# Patient Record
Sex: Male | Born: 1950 | Race: White | Hispanic: No | Marital: Married | Smoking: Never smoker
Health system: Southern US, Community
[De-identification: ages and names within clinical notes are randomized; demographics above are authoritative.]

## PROBLEM LIST (undated history)

## (undated) DIAGNOSIS — E119 Type 2 diabetes mellitus without complications: Secondary | ICD-10-CM

## (undated) DIAGNOSIS — I2510 Atherosclerotic heart disease of native coronary artery without angina pectoris: Secondary | ICD-10-CM

## (undated) DIAGNOSIS — C801 Malignant (primary) neoplasm, unspecified: Secondary | ICD-10-CM

## (undated) DIAGNOSIS — I10 Essential (primary) hypertension: Secondary | ICD-10-CM

## (undated) DIAGNOSIS — E7800 Pure hypercholesterolemia, unspecified: Secondary | ICD-10-CM

## (undated) HISTORY — PX: CORONARY ARTERY BYPASS GRAFT: SHX141

---

## 2015-04-23 ENCOUNTER — Emergency Department: Payer: BLUE CROSS/BLUE SHIELD

## 2015-04-23 ENCOUNTER — Other Ambulatory Visit: Payer: Self-pay

## 2015-04-23 ENCOUNTER — Emergency Department
Admission: EM | Admit: 2015-04-23 | Discharge: 2015-04-23 | Payer: BLUE CROSS/BLUE SHIELD | Attending: Emergency Medicine | Admitting: Emergency Medicine

## 2015-04-23 ENCOUNTER — Ambulatory Visit (HOSPITAL_COMMUNITY)
Admission: AD | Admit: 2015-04-23 | Discharge: 2015-04-23 | Disposition: A | Discharge status: Home / Self Care | Payer: BLUE CROSS/BLUE SHIELD | Source: Other Acute Inpatient Hospital | Attending: Emergency Medicine | Admitting: Emergency Medicine

## 2015-04-23 ENCOUNTER — Encounter: Payer: Self-pay | Admitting: Emergency Medicine

## 2015-04-23 DIAGNOSIS — N179 Acute kidney failure, unspecified: Secondary | ICD-10-CM | POA: Insufficient documentation

## 2015-04-23 DIAGNOSIS — R0989 Other specified symptoms and signs involving the circulatory and respiratory systems: Secondary | ICD-10-CM | POA: Insufficient documentation

## 2015-04-23 DIAGNOSIS — I10 Essential (primary) hypertension: Secondary | ICD-10-CM | POA: Insufficient documentation

## 2015-04-23 DIAGNOSIS — R55 Syncope and collapse: Secondary | ICD-10-CM

## 2015-04-23 DIAGNOSIS — I959 Hypotension, unspecified: Secondary | ICD-10-CM | POA: Insufficient documentation

## 2015-04-23 DIAGNOSIS — M545 Low back pain, unspecified: Secondary | ICD-10-CM

## 2015-04-23 DIAGNOSIS — E860 Dehydration: Secondary | ICD-10-CM | POA: Insufficient documentation

## 2015-04-23 DIAGNOSIS — E119 Type 2 diabetes mellitus without complications: Secondary | ICD-10-CM | POA: Insufficient documentation

## 2015-04-23 HISTORY — DX: Pure hypercholesterolemia, unspecified: E78.00

## 2015-04-23 HISTORY — DX: Atherosclerotic heart disease of native coronary artery without angina pectoris: I25.10

## 2015-04-23 HISTORY — DX: Essential (primary) hypertension: I10

## 2015-04-23 HISTORY — DX: Malignant (primary) neoplasm, unspecified: C80.1

## 2015-04-23 HISTORY — DX: Type 2 diabetes mellitus without complications: E11.9

## 2015-04-23 LAB — TROPONIN I
Troponin I: 0.06 ng/mL — ABNORMAL HIGH (ref <0.031)
Troponin I: 0.06 ng/mL — ABNORMAL HIGH (ref <0.031)

## 2015-04-23 LAB — BASIC METABOLIC PANEL
Anion gap: 6 (ref 5–15)
BUN: 25 mg/dL — ABNORMAL HIGH (ref 6–20)
CALCIUM: 8.5 mg/dL — AB (ref 8.9–10.3)
CO2: 27 mmol/L (ref 22–32)
CREATININE: 1.56 mg/dL — AB (ref 0.61–1.24)
Chloride: 100 mmol/L — ABNORMAL LOW (ref 101–111)
GFR calc Af Amer: 53 mL/min — ABNORMAL LOW (ref >60)
GFR calc non Af Amer: 46 mL/min — ABNORMAL LOW (ref >60)
GLUCOSE: 307 mg/dL — AB (ref 65–99)
Potassium: 4.5 mmol/L (ref 3.5–5.1)
Sodium: 133 mmol/L — ABNORMAL LOW (ref 135–145)

## 2015-04-23 LAB — URINALYSIS COMPLETE WITH MICROSCOPIC (ARMC ONLY)
Bilirubin Urine: NEGATIVE
Glucose, UA: NEGATIVE mg/dL
Hgb urine dipstick: NEGATIVE
Ketones, ur: NEGATIVE mg/dL
LEUKOCYTES UA: NEGATIVE
Nitrite: NEGATIVE
PH: 5 (ref 5.0–8.0)
PROTEIN: NEGATIVE mg/dL
RBC / HPF: NONE SEEN RBC/hpf (ref 0–5)
Specific Gravity, Urine: 1.006 (ref 1.005–1.030)
Squamous Epithelial / LPF: NONE SEEN
WBC, UA: NONE SEEN WBC/hpf (ref 0–5)

## 2015-04-23 LAB — CBC
HEMATOCRIT: 29.7 % — AB (ref 40.0–52.0)
HEMOGLOBIN: 9.6 g/dL — AB (ref 13.0–18.0)
MCH: 27.7 pg (ref 26.0–34.0)
MCHC: 32.3 g/dL (ref 32.0–36.0)
MCV: 85.8 fL (ref 80.0–100.0)
Platelets: 446 10*3/uL — ABNORMAL HIGH (ref 150–440)
RBC: 3.47 MIL/uL — AB (ref 4.40–5.90)
RDW: 14 % (ref 11.5–14.5)
WBC: 7.1 10*3/uL (ref 3.8–10.6)

## 2015-04-23 LAB — GLUCOSE, CAPILLARY: GLUCOSE-CAPILLARY: 141 mg/dL — AB (ref 70–99)

## 2015-04-23 MED ORDER — IOHEXOL 350 MG/ML SOLN
100.0000 mL | Freq: Once | INTRAVENOUS | Status: AC | PRN
  Administered 2015-04-23: 100 mL via INTRAVENOUS

## 2015-04-23 MED ORDER — SODIUM CHLORIDE 0.9 % IV SOLN
Freq: Once | INTRAVENOUS | Status: AC
  Administered 2015-04-23: 15:00:00 via INTRAVENOUS

## 2015-04-23 MED ORDER — SODIUM CHLORIDE 0.9 % IV BOLUS (SEPSIS)
1000.0000 mL | Freq: Once | INTRAVENOUS | Status: AC
  Administered 2015-04-23: 1000 mL via INTRAVENOUS

## 2015-04-23 NOTE — ED Provider Notes (Signed)
[facility] Emergency Department Provider Note   ____________________________________________  Time seen: 1:45 PM   I have reviewed the triage vital signs and the nursing notes.   HISTORY  Chief Complaint Near Syncope    HPI Ricky Parks is a 64 y.o. male who had a quadruple bypass 3 weeks ago at his home hospital near [facility]. Since that time he has had problems with orthostatic hypotension syncope and near syncope. He came in town to visit his brother-in-law who is dying at the hospice house. His family member did pass today. The patient felt that his blood pressure was dropping and staff stated that his color was pale. He did not pass out. He did not have any chest pain. He denies any shortness of breath. Symptoms are moderate. On Thursday his cardiologist instructed him to cut down his blood pressure pill to one half in the morning and one half at night only when necessary based off of checking the blood pressure in the morning and at night. Patient is extremely adamant that he does not want to stay in the hospital      Past Medical History  Diagnosis Date  . Coronary artery disease   . Diabetes mellitus without complication   . Hypertension   . Hypercholesteremia     There are no active problems to display for this patient.   Past Surgical History  Procedure Laterality Date  . Coronary artery bypass graft      No current outpatient prescriptions on file.  Allergies Review of patient's allergies indicates no known allergies.  History reviewed. No pertinent family history.  Social History History  Substance Use Topics  . Smoking status: Never Smoker   . Smokeless tobacco: Never Used  . Alcohol Use: No    Review of Systems  Constitutional: Negative for fever. Eyes: Negative for visual changes. ENT: Negative for sore throat. Cardiovascular: Negative for chest pain. Respiratory: Negative for shortness of  breath. Gastrointestinal: Negative for abdominal pain, vomiting and diarrhea. Genitourinary:  Musculoskeletal:  Skin: Negative for rash. Neurological: Negative for headaches, focal weakness or numbness.   10-point ROS otherwise negative.  ____________________________________________   PHYSICAL EXAM:  VITAL SIGNS: ED Triage Vitals  Enc Vitals Group     BP 04/23/15 1320 105/52 mmHg     Pulse Rate 04/23/15 1320 62     Resp 04/23/15 1320 20     Temp 04/23/15 1320 98.4 F (36.9 C)     Temp Source 04/23/15 1320 Oral     SpO2 04/23/15 1320 99 %     Weight 04/23/15 1320 191 lb (86.637 kg)     Height 04/23/15 1320 6' (1.829 m)     Head Cir --      Peak Flow --      Pain Score --      Pain Loc --      Pain Edu? --      Excl. in [place]? --      Constitutional: Alert and oriented. Well appearing and in no distress. Eyes: Conjunctivae are normal. PERRL. Normal extraocular movements. ENT   Head: Normocephalic and atraumatic.   Nose: No congestion/rhinnorhea.   Mouth/Throat: Mucous membranes are moist.   Neck: No stridor. Cardiovascular: Normal rate, regular rhythm.  No murmurs, rubs, or gallops. Respiratory: Normal respiratory effort without tachypnea nor retractions. Breath sounds are clear and equal bilaterally. No wheezes/rales/rhonchi. Gastrointestinal: Soft and nontender. No distention.  Genitourinary:  Musculoskeletal: Nontender with normal range of motion in  all extremities. No joint effusions.  No lower extremity tenderness nor edema. Neurologic:  Normal speech and language. No gross focal neurologic deficits are appreciated. Speech is normal. No gait instability. Skin:  Skin is warm, dry and intact. No rash noted. Psychiatric: Mood and affect are normal. Speech and behavior are normal. Patient exhibits appropriate insight and judgment.  ____________________________________________   EKG  No prior EKG for comparison 62 bpm normal sinus rhythm. Left axis  deviation. Q waves septally. T-wave inversions V2 through V6.  ____________________________________________   LABS (pertinent positives/negatives)  Urinalysis negative Troponin 0.06 and 0.06 on 3R repeat BUN 25 cranny 1.56 Glucose 307 Hemoglobin 9.6 with white blood cell count normal at 7.1  ____________________________________________    RADIOLOGY  Chest x-ray findings reviewed: Negative  Reviewed CT angios chest abdomen pelvis and discussed with the radiologist: No aortic dissection. Recent CABG with gas and fluid collection in the mediastinum raising concern for possible infectious mediastinitis and abscess, 2 subcentimeter outpouchings from the anterior ascending aorta which may represent small pseudoaneurysms or possibly occluded grafts ____________________________________________   PROCEDURES  Procedure(s) performed: None Critical Care performed:  No  ____________________________________________   INITIAL IMPRESSION / ASSESSMENT AND PLAN / ED COURSE  Pertinent labs & imaging results that were available during my care of the patient were reviewed by me and considered in my medical decision making (see chart for details).  Initially felt patient's drop in blood pressure upon standing was likely due to his recent orthostatic hypotension episodes due to blood pressure medication changes since his recent CABG. His BUN/creatinine are elevated raising concern for dehydration. Patient was given 1 L normal saline and orthostatic to repeated and still found to be hypotensive in the 80s upon standing. Second liter was bolused was given and repeat orthostatics still found him to be hypotensive in the 80s upon standing but with a good pressure above 110 at rest. Patient is pretty adamant that he would like to go home and I told him we needed to get his blood pressure managed. It was noted by the nurse that he was starting to have hypotension in the 70s and 80s even at rest, although he  had no fever and no tachycardia. I started a third liter of fluid and considered other etiologies of hypotension including possibility of some sort of sepsis. He has no white blood cell count is chest x-ray was clear. His urine analysis is negative for urinary tract infection. Blood cultures were sent. Patient was complaining of some low back pain initially thought this was due to having been lying on the stretcher for too long however given the hypotension and the back pain chose to do a CT to rule out any aortic emergency.  Radiologist called me to discuss the CT results including gas and fluid in the mediastinum which could be postsurgical versus mediastinitis and abscess. The patient is not having chest pain, fever, elevated white blood cell count, or chest pain and so I doubt the diagnosis of mediastinitis and abscess. There is also finding of possible graft occlusion of the CT result. The patient again is not having acute chest pain and troponin is minimally elevated at 0.06 but unchanged over several hours. I have no prior EKG for comparison. I discussed with the patient has family the need for hospitalization at least for 24 observation in order to assess for any change in his current condition and to see a cardiologist regarding the CT scan results about the grafts. Patient would like to  be transferred back to his home hospital and cardiologist and cardiothoracic surgeon in [facility]. I discussed with them the risk of the prolonged transport although I do feel he is safe for transport.  I discussed with the cardiologist on-call at patient's home hospital and discussed the CT results and the patient's clinical picture. Without chest pain shortness of breath or EKG abnormalities the cardiologist has no additional recognition at this point time but the patient to be transferred to either hospitalist or CT surgery's service. Patient's clinical picture does not specifically suggest the  diagnosis of infectious mediastinitis, or acute graft occlusion.  I spoke with Dr. Christia Reading, hospitalist, who accepted the patient in transfer.   ____________________________________________   FINAL CLINICAL IMPRESSION(S) / ED DIAGNOSES  Acute dehydration with mild acute renal failure and hypotension Possible cardiac graft occlusion    Lisa Roca, MD 04/23/15 2150

## 2015-04-23 NOTE — ED Notes (Signed)
Patient to ED after near syncopal episode at [facility]. Patient reports cardiac bypass about a month and has passed out 3 times since then. Patient was at [facility] due to the very recent passing of his brother in law. Patient appears pale on assessment.

## 2015-04-23 NOTE — ED Notes (Signed)
CRITICAL VALUE ALERT  Critical value received:  Troponin 0.06  Date of notification:  04/23/15  Time of notification:  141  Critical value read back:Yes.    Nurse who received alert:   Desanctis  MD notified (1st page):  1415  Time of first page:  1415  MD notified (2nd page):  Time of second page:  Responding MD: Dr Reita Cliche  Time MD responded:  [phone]

## 2015-04-23 NOTE — ED Notes (Signed)
Blood pressure 84/44, pt resting, pt denies pain or any problems, Dr.Lord notified

## 2015-04-23 NOTE — ED Notes (Signed)
Patient transported to CT 

## 2015-04-23 NOTE — ED Notes (Signed)
Stood pt blood pressure dropped from 127/81 to 83/53, pt became hot and dizzy, Dr.Lord notified

## 2015-04-28 LAB — CULTURE, BLOOD (ROUTINE X 2)
Culture: NO GROWTH
Culture: NO GROWTH

## 2017-03-12 ENCOUNTER — Encounter: Payer: Self-pay | Admitting: *Deleted

## 2017-03-12 ENCOUNTER — Emergency Department: Payer: 59

## 2017-03-12 ENCOUNTER — Inpatient Hospital Stay: Payer: 59

## 2017-03-12 ENCOUNTER — Inpatient Hospital Stay
Admission: EM | Admit: 2017-03-12 | Discharge: 2017-03-14 | DRG: 092 | Disposition: A | Discharge status: Home Health | Payer: 59 | Attending: Internal Medicine | Admitting: Internal Medicine

## 2017-03-12 DIAGNOSIS — K7290 Hepatic failure, unspecified without coma: Secondary | ICD-10-CM | POA: Diagnosis present

## 2017-03-12 DIAGNOSIS — Z7982 Long term (current) use of aspirin: Secondary | ICD-10-CM

## 2017-03-12 DIAGNOSIS — R569 Unspecified convulsions: Secondary | ICD-10-CM | POA: Diagnosis present

## 2017-03-12 DIAGNOSIS — R4182 Altered mental status, unspecified: Secondary | ICD-10-CM | POA: Diagnosis present

## 2017-03-12 DIAGNOSIS — Z8249 Family history of ischemic heart disease and other diseases of the circulatory system: Secondary | ICD-10-CM | POA: Diagnosis not present

## 2017-03-12 DIAGNOSIS — N179 Acute kidney failure, unspecified: Secondary | ICD-10-CM | POA: Diagnosis present

## 2017-03-12 DIAGNOSIS — Z79899 Other long term (current) drug therapy: Secondary | ICD-10-CM

## 2017-03-12 DIAGNOSIS — Z951 Presence of aortocoronary bypass graft: Secondary | ICD-10-CM

## 2017-03-12 DIAGNOSIS — R4781 Slurred speech: Secondary | ICD-10-CM | POA: Diagnosis present

## 2017-03-12 DIAGNOSIS — F329 Major depressive disorder, single episode, unspecified: Secondary | ICD-10-CM | POA: Diagnosis present

## 2017-03-12 DIAGNOSIS — R410 Disorientation, unspecified: Secondary | ICD-10-CM

## 2017-03-12 DIAGNOSIS — Z823 Family history of stroke: Secondary | ICD-10-CM | POA: Diagnosis not present

## 2017-03-12 DIAGNOSIS — I2510 Atherosclerotic heart disease of native coronary artery without angina pectoris: Secondary | ICD-10-CM | POA: Diagnosis present

## 2017-03-12 DIAGNOSIS — I129 Hypertensive chronic kidney disease with stage 1 through stage 4 chronic kidney disease, or unspecified chronic kidney disease: Secondary | ICD-10-CM | POA: Diagnosis present

## 2017-03-12 DIAGNOSIS — E7800 Pure hypercholesterolemia, unspecified: Secondary | ICD-10-CM | POA: Diagnosis present

## 2017-03-12 DIAGNOSIS — I509 Heart failure, unspecified: Secondary | ICD-10-CM

## 2017-03-12 DIAGNOSIS — Z794 Long term (current) use of insulin: Secondary | ICD-10-CM

## 2017-03-12 DIAGNOSIS — F0390 Unspecified dementia without behavioral disturbance: Secondary | ICD-10-CM | POA: Diagnosis present

## 2017-03-12 DIAGNOSIS — E7220 Disorder of urea cycle metabolism, unspecified: Secondary | ICD-10-CM | POA: Diagnosis present

## 2017-03-12 DIAGNOSIS — Z7901 Long term (current) use of anticoagulants: Secondary | ICD-10-CM | POA: Diagnosis not present

## 2017-03-12 DIAGNOSIS — Z8673 Personal history of transient ischemic attack (TIA), and cerebral infarction without residual deficits: Secondary | ICD-10-CM

## 2017-03-12 DIAGNOSIS — E1122 Type 2 diabetes mellitus with diabetic chronic kidney disease: Secondary | ICD-10-CM | POA: Diagnosis present

## 2017-03-12 DIAGNOSIS — K7682 Hepatic encephalopathy: Secondary | ICD-10-CM

## 2017-03-12 DIAGNOSIS — G92 Toxic encephalopathy: Principal | ICD-10-CM | POA: Diagnosis present

## 2017-03-12 DIAGNOSIS — Z820 Family history of epilepsy and other diseases of the nervous system: Secondary | ICD-10-CM

## 2017-03-12 DIAGNOSIS — T426X5A Adverse effect of other antiepileptic and sedative-hypnotic drugs, initial encounter: Secondary | ICD-10-CM | POA: Diagnosis present

## 2017-03-12 DIAGNOSIS — N183 Chronic kidney disease, stage 3 (moderate): Secondary | ICD-10-CM | POA: Diagnosis present

## 2017-03-12 DIAGNOSIS — Z833 Family history of diabetes mellitus: Secondary | ICD-10-CM

## 2017-03-12 DIAGNOSIS — Z66 Do not resuscitate: Secondary | ICD-10-CM | POA: Diagnosis present

## 2017-03-12 DIAGNOSIS — G9340 Encephalopathy, unspecified: Secondary | ICD-10-CM

## 2017-03-12 LAB — URINE DRUG SCREEN, QUALITATIVE (ARMC ONLY)
Amphetamines, Ur Screen: NOT DETECTED
Barbiturates, Ur Screen: NOT DETECTED
Benzodiazepine, Ur Scrn: NOT DETECTED
CANNABINOID 50 NG, UR ~~LOC~~: NOT DETECTED
COCAINE METABOLITE, UR ~~LOC~~: NOT DETECTED
MDMA (Ecstasy)Ur Screen: NOT DETECTED
METHADONE SCREEN, URINE: NOT DETECTED
Opiate, Ur Screen: NOT DETECTED
Phencyclidine (PCP) Ur S: NOT DETECTED
Tricyclic, Ur Screen: POSITIVE — AB

## 2017-03-12 LAB — COMPREHENSIVE METABOLIC PANEL
ALT: 18 U/L (ref 17–63)
AST: 26 U/L (ref 15–41)
Albumin: 3.2 g/dL — ABNORMAL LOW (ref 3.5–5.0)
Alkaline Phosphatase: 34 U/L — ABNORMAL LOW (ref 38–126)
Anion gap: 7 (ref 5–15)
BUN: 31 mg/dL — ABNORMAL HIGH (ref 6–20)
CHLORIDE: 102 mmol/L (ref 101–111)
CO2: 24 mmol/L (ref 22–32)
CREATININE: 1.85 mg/dL — AB (ref 0.61–1.24)
Calcium: 8.8 mg/dL — ABNORMAL LOW (ref 8.9–10.3)
GFR calc Af Amer: 43 mL/min — ABNORMAL LOW (ref >60)
GFR calc non Af Amer: 37 mL/min — ABNORMAL LOW (ref >60)
Glucose, Bld: 317 mg/dL — ABNORMAL HIGH (ref 65–99)
Potassium: 4.7 mmol/L (ref 3.5–5.1)
Sodium: 133 mmol/L — ABNORMAL LOW (ref 135–145)
Total Bilirubin: 0.5 mg/dL (ref 0.3–1.2)
Total Protein: 6.4 g/dL — ABNORMAL LOW (ref 6.5–8.1)

## 2017-03-12 LAB — URINALYSIS, COMPLETE (UACMP) WITH MICROSCOPIC
Bacteria, UA: NONE SEEN
Bilirubin Urine: NEGATIVE
Hgb urine dipstick: NEGATIVE
KETONES UR: NEGATIVE mg/dL
Leukocytes, UA: NEGATIVE
Nitrite: NEGATIVE
PROTEIN: 30 mg/dL — AB
RBC / HPF: NONE SEEN RBC/hpf (ref 0–5)
SQUAMOUS EPITHELIAL / LPF: NONE SEEN
Specific Gravity, Urine: 1.02 (ref 1.005–1.030)
pH: 6 (ref 5.0–8.0)

## 2017-03-12 LAB — BLOOD GAS, VENOUS
ACID-BASE DEFICIT: 0.4 mmol/L (ref 0.0–2.0)
Bicarbonate: 25.9 mmol/L (ref 20.0–28.0)
O2 Saturation: 86.7 %
PH VEN: 7.34 (ref 7.250–7.430)
PO2 VEN: 56 mmHg — AB (ref 32.0–45.0)
Patient temperature: 37
pCO2, Ven: 48 mmHg (ref 44.0–60.0)

## 2017-03-12 LAB — VALPROIC ACID LEVEL: Valproic Acid Lvl: <10 ug/mL — ABNORMAL LOW (ref 50.0–100.0)

## 2017-03-12 LAB — GLUCOSE, CAPILLARY
Glucose-Capillary: 332 mg/dL — ABNORMAL HIGH (ref 65–99)
Glucose-Capillary: 346 mg/dL — ABNORMAL HIGH (ref 65–99)

## 2017-03-12 LAB — TROPONIN I: Troponin I: <0.03 ng/mL (ref <0.03)

## 2017-03-12 LAB — CBC
HEMATOCRIT: 36.9 % — AB (ref 40.0–52.0)
Hemoglobin: 12.7 g/dL — ABNORMAL LOW (ref 13.0–18.0)
MCH: 30 pg (ref 26.0–34.0)
MCHC: 34.3 g/dL (ref 32.0–36.0)
MCV: 87.5 fL (ref 80.0–100.0)
PLATELETS: 226 10*3/uL (ref 150–440)
RBC: 4.21 MIL/uL — AB (ref 4.40–5.90)
RDW: 14.9 % — ABNORMAL HIGH (ref 11.5–14.5)
WBC: 5.1 10*3/uL (ref 3.8–10.6)

## 2017-03-12 LAB — PROTIME-INR
INR: 0.96
PROTHROMBIN TIME: 12.8 s (ref 11.4–15.2)

## 2017-03-12 LAB — AMMONIA: Ammonia: 51 umol/L — ABNORMAL HIGH (ref 9–35)

## 2017-03-12 MED ORDER — DOCUSATE SODIUM 100 MG PO CAPS
100.0000 mg | ORAL_CAPSULE | Freq: Two times a day (BID) | ORAL | Status: DC
  Administered 2017-03-12 – 2017-03-14 (×4): 100 mg via ORAL
  Filled 2017-03-12 (×4): qty 1

## 2017-03-12 MED ORDER — GABAPENTIN 300 MG PO CAPS
300.0000 mg | ORAL_CAPSULE | Freq: Three times a day (TID) | ORAL | Status: DC
  Administered 2017-03-12 – 2017-03-13 (×2): 300 mg via ORAL
  Filled 2017-03-12 (×2): qty 1

## 2017-03-12 MED ORDER — APIXABAN 5 MG PO TABS
5.0000 mg | ORAL_TABLET | Freq: Two times a day (BID) | ORAL | Status: DC
  Administered 2017-03-12 – 2017-03-14 (×4): 5 mg via ORAL
  Filled 2017-03-12 (×4): qty 1

## 2017-03-12 MED ORDER — CARVEDILOL 3.125 MG PO TABS
3.1250 mg | ORAL_TABLET | Freq: Two times a day (BID) | ORAL | Status: DC
  Administered 2017-03-13 – 2017-03-14 (×3): 3.125 mg via ORAL
  Filled 2017-03-12 (×3): qty 1

## 2017-03-12 MED ORDER — OMEGA-3-ACID ETHYL ESTERS 1 G PO CAPS
1.0000 g | ORAL_CAPSULE | Freq: Every day | ORAL | Status: DC
  Administered 2017-03-13 – 2017-03-14 (×2): 1 g via ORAL
  Filled 2017-03-12 (×2): qty 1

## 2017-03-12 MED ORDER — ACETAMINOPHEN 325 MG PO TABS: 650.0000 mg | ORAL_TABLET | Freq: Four times a day (QID) | ORAL | Status: DC | PRN

## 2017-03-12 MED ORDER — BISACODYL 5 MG PO TBEC: 5.0000 mg | DELAYED_RELEASE_TABLET | Freq: Every day | ORAL | Status: DC | PRN

## 2017-03-12 MED ORDER — SODIUM CHLORIDE 0.9 % IV SOLN: INTRAVENOUS | Status: DC

## 2017-03-12 MED ORDER — ONDANSETRON HCL 4 MG PO TABS: 4.0000 mg | ORAL_TABLET | Freq: Four times a day (QID) | ORAL | Status: DC | PRN

## 2017-03-12 MED ORDER — DULOXETINE HCL 60 MG PO CPEP
60.0000 mg | ORAL_CAPSULE | Freq: Every morning | ORAL | Status: DC
  Administered 2017-03-13 – 2017-03-14 (×2): 60 mg via ORAL
  Filled 2017-03-12 (×2): qty 1

## 2017-03-12 MED ORDER — NALOXONE HCL 2 MG/2ML IJ SOSY
1.0000 mg | PREFILLED_SYRINGE | Freq: Once | INTRAMUSCULAR | Status: AC
  Administered 2017-03-12: 1 mg via INTRAVENOUS

## 2017-03-12 MED ORDER — VITAMIN B-12 1000 MCG PO TABS
1000.0000 ug | ORAL_TABLET | Freq: Every day | ORAL | Status: DC
  Administered 2017-03-13 – 2017-03-14 (×2): 1000 ug via ORAL
  Filled 2017-03-12 (×2): qty 1

## 2017-03-12 MED ORDER — FERROUS SULFATE 325 (65 FE) MG PO TABS
325.0000 mg | ORAL_TABLET | Freq: Three times a day (TID) | ORAL | Status: DC
  Administered 2017-03-12 – 2017-03-14 (×4): 325 mg via ORAL
  Filled 2017-03-12 (×4): qty 1

## 2017-03-12 MED ORDER — PRAVASTATIN SODIUM 40 MG PO TABS
80.0000 mg | ORAL_TABLET | Freq: Every day | ORAL | Status: DC
  Administered 2017-03-13 – 2017-03-14 (×2): 80 mg via ORAL
  Filled 2017-03-12 (×2): qty 2

## 2017-03-12 MED ORDER — MEMANTINE HCL 5 MG PO TABS
5.0000 mg | ORAL_TABLET | Freq: Two times a day (BID) | ORAL | Status: DC
  Administered 2017-03-13 – 2017-03-14 (×4): 5 mg via ORAL
  Filled 2017-03-12 (×5): qty 1

## 2017-03-12 MED ORDER — ONDANSETRON HCL 4 MG/2ML IJ SOLN: 4.0000 mg | Freq: Four times a day (QID) | INTRAMUSCULAR | Status: DC | PRN

## 2017-03-12 MED ORDER — FENOFIBRATE 54 MG PO TABS
54.0000 mg | ORAL_TABLET | Freq: Every day | ORAL | Status: DC
  Administered 2017-03-13 – 2017-03-14 (×2): 54 mg via ORAL
  Filled 2017-03-12 (×2): qty 1

## 2017-03-12 MED ORDER — LACTULOSE 10 GM/15ML PO SOLN
20.0000 g | Freq: Two times a day (BID) | ORAL | Status: DC
  Administered 2017-03-12 – 2017-03-14 (×4): 20 g via ORAL
  Filled 2017-03-12 (×4): qty 30

## 2017-03-12 MED ORDER — LOSARTAN POTASSIUM 50 MG PO TABS
25.0000 mg | ORAL_TABLET | Freq: Every day | ORAL | Status: DC
  Administered 2017-03-13 – 2017-03-14 (×2): 25 mg via ORAL
  Filled 2017-03-12 (×2): qty 1

## 2017-03-12 MED ORDER — CITALOPRAM HYDROBROMIDE 20 MG PO TABS
40.0000 mg | ORAL_TABLET | Freq: Every day | ORAL | Status: DC
  Administered 2017-03-13 – 2017-03-14 (×2): 40 mg via ORAL
  Filled 2017-03-12 (×2): qty 2

## 2017-03-12 MED ORDER — DIVALPROEX SODIUM 250 MG PO DR TAB
250.0000 mg | DELAYED_RELEASE_TABLET | Freq: Two times a day (BID) | ORAL | Status: DC
  Filled 2017-03-12: qty 1

## 2017-03-12 MED ORDER — INSULIN DEGLUDEC 200 UNIT/ML ~~LOC~~ SOPN: 54.0000 [IU] | PEN_INJECTOR | Freq: Every day | SUBCUTANEOUS | Status: DC

## 2017-03-12 MED ORDER — INSULIN ASPART 100 UNIT/ML ~~LOC~~ SOLN
4.0000 [IU] | Freq: Three times a day (TID) | SUBCUTANEOUS | Status: DC
Start: 2017-03-13 — End: 2017-03-14
  Administered 2017-03-13 – 2017-03-14 (×3): 4 [IU] via SUBCUTANEOUS
  Filled 2017-03-12 (×4): qty 4

## 2017-03-12 MED ORDER — LACTULOSE 10 GM/15ML PO SOLN
20.0000 g | Freq: Once | ORAL | Status: AC
  Administered 2017-03-12: 20 g via ORAL
  Filled 2017-03-12: qty 30

## 2017-03-12 MED ORDER — NALOXONE HCL 2 MG/2ML IJ SOSY
0.4000 mg | PREFILLED_SYRINGE | Freq: Once | INTRAMUSCULAR | Status: AC
  Administered 2017-03-12: 0.4 mg via INTRAVENOUS
  Filled 2017-03-12: qty 2

## 2017-03-12 MED ORDER — ADULT MULTIVITAMIN W/MINERALS CH
ORAL_TABLET | Freq: Every day | ORAL | Status: DC
  Administered 2017-03-13 – 2017-03-14 (×2): 1 via ORAL
  Filled 2017-03-12 (×2): qty 1

## 2017-03-12 MED ORDER — ASPIRIN EC 325 MG PO TBEC
325.0000 mg | DELAYED_RELEASE_TABLET | Freq: Every day | ORAL | Status: DC
  Administered 2017-03-13 – 2017-03-14 (×2): 325 mg via ORAL
  Filled 2017-03-12 (×2): qty 1

## 2017-03-12 MED ORDER — INSULIN GLARGINE 100 UNIT/ML ~~LOC~~ SOLN
32.0000 [IU] | Freq: Every day | SUBCUTANEOUS | Status: DC
  Administered 2017-03-12 – 2017-03-14 (×3): 32 [IU] via SUBCUTANEOUS
  Filled 2017-03-12 (×4): qty 0.32

## 2017-03-12 MED ORDER — ACETAMINOPHEN 650 MG RE SUPP: 650.0000 mg | Freq: Four times a day (QID) | RECTAL | Status: DC | PRN

## 2017-03-12 MED ORDER — DULOXETINE HCL 30 MG PO CPEP: 30.0000 mg | ORAL_CAPSULE | Freq: Every day | ORAL | Status: DC

## 2017-03-12 MED ORDER — ISOSORBIDE DINITRATE 30 MG PO TABS
30.0000 mg | ORAL_TABLET | Freq: Four times a day (QID) | ORAL | Status: DC
  Administered 2017-03-13 – 2017-03-14 (×5): 30 mg via ORAL
  Filled 2017-03-12 (×9): qty 1

## 2017-03-12 MED ORDER — INSULIN ASPART 100 UNIT/ML ~~LOC~~ SOLN
0.0000 [IU] | Freq: Three times a day (TID) | SUBCUTANEOUS | Status: DC
  Administered 2017-03-13 (×2): 8 [IU] via SUBCUTANEOUS
  Administered 2017-03-14: 09:00:00 5 [IU] via SUBCUTANEOUS
  Filled 2017-03-12 (×2): qty 8
  Filled 2017-03-12: qty 5

## 2017-03-12 MED ORDER — FAMOTIDINE 20 MG PO TABS
20.0000 mg | ORAL_TABLET | Freq: Two times a day (BID) | ORAL | Status: DC
  Administered 2017-03-12 – 2017-03-14 (×4): 20 mg via ORAL
  Filled 2017-03-12 (×4): qty 1

## 2017-03-12 MED ORDER — QUETIAPINE FUMARATE 25 MG PO TABS
50.0000 mg | ORAL_TABLET | Freq: Every day | ORAL | Status: DC
  Administered 2017-03-12 – 2017-03-13 (×2): 50 mg via ORAL
  Filled 2017-03-12 (×2): qty 2

## 2017-03-12 MED ORDER — DULOXETINE HCL 30 MG PO CPEP
30.0000 mg | ORAL_CAPSULE | Freq: Every evening | ORAL | Status: DC
  Administered 2017-03-13: 30 mg via ORAL
  Filled 2017-03-12: qty 1

## 2017-03-12 MED ORDER — LIRAGLUTIDE 18 MG/3ML ~~LOC~~ SOPN: 1.8000 mg | PEN_INJECTOR | Freq: Every day | SUBCUTANEOUS | Status: DC

## 2017-03-12 MED ORDER — SODIUM CHLORIDE 0.9 % IV SOLN
INTRAVENOUS | Status: AC
  Administered 2017-03-12 – 2017-03-13 (×2): via INTRAVENOUS

## 2017-03-12 MED ORDER — COLESEVELAM HCL 625 MG PO TABS
1875.0000 mg | ORAL_TABLET | Freq: Two times a day (BID) | ORAL | Status: DC
  Administered 2017-03-13 – 2017-03-14 (×2): 1875 mg via ORAL
  Filled 2017-03-12 (×4): qty 3

## 2017-03-12 NOTE — ED Notes (Signed)
MRI called this RN and reports they will call cardiologist themselves and request to speak with MD. Family updated.

## 2017-03-12 NOTE — ED Notes (Signed)
Admitting MD at the bedside.  

## 2017-03-12 NOTE — ED Notes (Signed)
RN called Dr. Daron Offer in [place] in regards to pts loop recorder. Per MRI loop recorder can erase data. RN called after hours line in order to inform MD of this. RN in [place] reports Dr. Daron Offer will call back shortly.

## 2017-03-12 NOTE — ED Notes (Signed)
Patient transported to MRI 

## 2017-03-12 NOTE — H&P (Signed)
Ricky Parks at [place] NAME: Ricky Parks    MR#:  [id number]  DATE OF BIRTH:  03/12/1951  DATE OF ADMISSION:  03/12/2017  PRIMARY CARE PHYSICIAN: Ricky Parks   REQUESTING/REFERRING PHYSICIAN: Merlyn Lot.  CHIEF COMPLAINT: Altered mental status    Chief Complaint  Patient presents with  . Altered Mental Status    HISTORY OF PRESENT ILLNESS:  Ricky Parks  is a 66 y.o. male with a known history of Cryptogenic strokes brought in by family because of sudden onset of altered mental status associated with problems with coordination, slight slurred speech. Patient was up with Dr. Lorenda Parks from [facility] neurology, last seen neurologist in a week ago that time his Depakote has been increased to 500 mg twice a day, patient also sees psychiatric for depression, recently increased Seroquel from 50 mg 200 mg on Thursday. And patient has been having episodes of confusion recently. Patient did Depakote level undetectable in the blood today with elevated ammonia up to more than 50. MRI of the brain showed old strokes in the right frontal, right occipital area but no new strokes. Patient is eating his dinner at this time.   PAST MEDICAL HISTORY:   Past Medical History:  Diagnosis Date  . Cancer ([place])   . Coronary artery disease   . Diabetes mellitus without complication ([place])   . Hypercholesteremia   . Hypertension     PAST SURGICAL HISTOIRY:   Past Surgical History:  Procedure Laterality Date  . CORONARY ARTERY BYPASS GRAFT      SOCIAL HISTORY:   Social History  Substance Use Topics  . Smoking status: Never Smoker  . Smokeless tobacco: Never Used  . Alcohol use No    FAMILY HISTORY:  History reviewed. No pertinent family history.  DRUG ALLERGIES:  No Known Allergies  REVIEW OF SYSTEMS:  CONSTITUTIONAL: No fever, fatigue or weakness.  EYES: No blurred or double vision.  EARS, NOSE, AND THROAT: No tinnitus or ear  pain.  RESPIRATORY: No cough, shortness of breath, wheezing or hemoptysis.  CARDIOVASCULAR: No chest pain, orthopnea, edema.  GASTROINTESTINAL: No nausea, vomiting, diarrhea or abdominal pain.  GENITOURINARY: No dysuria, hematuria.  ENDOCRINE: No polyuria, nocturia,  HEMATOLOGY: No anemia, easy bruising or bleeding SKIN: No rash or lesion. MUSCULOSKELETAL: No joint pain or arthritis.   NEUROLOGIC: No tingling, numbness, weakness.  PSYCHIATRY: No anxiety or depression.   MEDICATIONS AT HOME:   Prior to Admission medications   Medication Sig Start Date End Date Taking? Authorizing Provider  apixaban (ELIQUIS) 5 MG TABS tablet Take 5 mg by mouth 2 (two) times daily.   Yes Historical Provider, MD  aspirin 325 MG tablet Take 325 mg by mouth daily.   Yes Historical Provider, MD  B Complex-C (SUPER B COMPLEX PO) Take by mouth.   Yes Historical Provider, MD  butalbital-acetaminophen-caffeine (FIORICET, ESGIC) 50-325-40 MG tablet Take by mouth 2 (two) times daily as needed for headache.   Yes Historical Provider, MD  carvedilol (COREG) 3.125 MG tablet Take 3.125 mg by mouth 2 (two) times daily with a meal.   Yes Historical Provider, MD  Cholecalciferol (VITAMIN D3) 1000 UNITS CAPS Take 4 capsules by mouth daily.    Yes Historical Provider, MD  colesevelam (WELCHOL) 625 MG tablet Take 1,875 mg by mouth 2 (two) times daily with a meal.   Yes Historical Provider, MD  divalproex (DEPAKOTE) 250 MG DR tablet Take 250 mg by mouth 2 (two)  times daily.   Yes Historical Provider, MD  DULoxetine (CYMBALTA) 30 MG capsule Take 30-60 mg by mouth daily. 60mg  AM & 30mg  PM   Yes Historical Provider, MD  fenofibrate (TRICOR) 145 MG tablet Take 145 mg by mouth daily.   Yes Historical Provider, MD  fluticasone (FLONASE) 50 MCG/ACT nasal spray Place 2 sprays into both nostrils daily as needed.  01/26/15  Yes Historical Provider, MD  gabapentin (NEURONTIN) 300 MG capsule Take 300 mg by mouth 3 (three) times daily.   Yes  Historical Provider, MD  HUMALOG KWIKPEN 100 UNIT/ML KiwkPen Inject 32 Units into the skin 3 (three) times daily.  02/19/17  Yes Historical Provider, MD  isosorbide dinitrate (ISORDIL) 30 MG tablet Take 30 mg by mouth 4 (four) times daily.   Yes Historical Provider, MD  losartan (COZAAR) 25 MG tablet Take 25 mg by mouth daily.   Yes Historical Provider, MD  memantine (NAMENDA) 5 MG tablet Take 5 mg by mouth 2 (two) times daily.   Yes Historical Provider, MD  omega-3 acid ethyl esters (LOVAZA) 1 g capsule Take 1 g by mouth daily.   Yes Historical Provider, MD  QUEtiapine (SEROQUEL) 100 MG tablet Take 100 mg by mouth at bedtime.    Yes Historical Provider, MD  TRESIBA FLEXTOUCH 200 UNIT/ML SOPN Inject 54 Units into the skin daily.  02/25/17  Yes Historical Provider, MD  VICTOZA 18 MG/3ML SOPN Inject 1.8 mg into the skin daily.  02/19/17  Yes Historical Provider, MD  citalopram (CELEXA) 40 MG tablet Take 1 tablet by mouth daily.    Historical Provider, MD  cyanocobalamin 500 MCG tablet Take 2 tablets by mouth daily.    Historical Provider, MD  famotidine (PEPCID) 20 MG tablet Take 1 tablet by mouth 2 (two) times daily. For 30 days 04/11/15 05/11/15  Historical Provider, MD  ferrous sulfate 325 (65 FE) MG tablet Take 1 tablet by mouth 3 (three) times daily. For 30 days 04/11/15 05/11/15  Historical Provider, MD  insulin aspart (NOVOLOG) 100 UNIT/ML injection For Blood Glucose (mg/dL):Less than 70 = Follow Hypoglycemia Protocol70 - 119 = No Insulin120-149 = 1 unit 150-199 = 3 Units200-249 = 5 Units250-299 = 7 Units300-349 = 8 UnitsGreater than 349 = 8 units, recheck glucose in 2 hours and call MD if results greater then 349. **HIGH ALERT MED**  Independent double check required.   This is a fast-acting insulin and must be administered with food or source of glucose. 04/11/15   Historical Provider, MD  LANTUS SOLOSTAR 100 UNIT/ML Solostar Pen Inject 32 Units into the skin daily. 03/21/15   Historical Provider, MD   Multiple Vitamin (MULTI-VITAMINS) TABS Take 1 tablet by mouth daily.    Historical Provider, MD  Omega-3 Fatty Acids (FISH OIL) 1000 MG CAPS Take 4 capsules by mouth daily.    Historical Provider, MD  oxyCODONE-acetaminophen (PERCOCET/ROXICET) 5-325 MG per tablet Take 1-2 tablets by mouth every 4 (four) hours as needed. 04/11/15   Historical Provider, MD  pravastatin (PRAVACHOL) 80 MG tablet Take 1 tablet by mouth daily. 04/11/15   Historical Provider, MD      VITAL SIGNS:  Blood pressure 130/78, pulse 70, temperature 97.6 F (36.4 C), temperature source Oral, resp. rate 16, weight 86.6 kg (191 lb), SpO2 94 %.  PHYSICAL EXAMINATION:  GENERAL:  66 y.o.-year-old patient lying in the bed with no acute distress.  EYES: Pupils equal, round, reactive to light and accommodation. No scleral icterus. Extraocular muscles intact.  HEENT: Head atraumatic,  normocephalic. Oropharynx and nasopharynx clear.  NECK:  Supple, no jugular venous distention. No thyroid enlargement, no tenderness.  LUNGS: Normal breath sounds bilaterally, no wheezing, rales,rhonchi or crepitation. No use of accessory muscles of respiration.  CARDIOVASCULAR: S1, S2 normal. No murmurs, rubs, or gallops.  ABDOMEN: Soft, nontender, nondistended. Bowel sounds present. No organomegaly or mass.  EXTREMITIES: No pedal edema, cyanosis, or clubbing.  NEUROLOGIC: Cranial nerves II through XII are intact. Muscle strength 5/5 in all extremities. Sensation intact. Gait not checked.  PSYCHIATRIC: The patient is alert and oriented x 3.  SKIN: No obvious rash, lesion, or ulcer.   LABORATORY PANEL:   CBC  Recent Labs Lab 03/12/17 1624  WBC 5.1  HGB 12.7*  HCT 36.9*  PLT 226   ------------------------------------------------------------------------------------------------------------------  Chemistries   Recent Labs Lab 03/12/17 1624  NA 133*  K 4.7  CL 102  CO2 24  GLUCOSE 317*  BUN 31*  CREATININE 1.85*  CALCIUM 8.8*  AST  26  ALT 18  ALKPHOS 34*  BILITOT 0.5   ------------------------------------------------------------------------------------------------------------------  Cardiac Enzymes  Recent Labs Lab 03/12/17 1624  TROPONINI <0.03   ------------------------------------------------------------------------------------------------------------------  RADIOLOGY:  Ct Head Wo Contrast  Result Date: 03/12/2017 CLINICAL DATA:  Lethargic with decreased alertness. EXAM: CT HEAD WITHOUT CONTRAST TECHNIQUE: Contiguous axial images were obtained from the base of the skull through the vertex without intravenous contrast. COMPARISON:  None. FINDINGS: Brain: Hypo attenuation is identified in the posterior right frontal region, likely related to previous infarct given a degree of overlying ex vacuo dilatation right lateral ventricle. Low density in the right occipital lobe suggests prior right PCA territory infarct. Lacunar infarction noted in the basal ganglia bilaterally. No evidence for acute hemorrhage, hydrocephalus, mass lesion, or abnormal extra-axial fluid collection. Vascular: No hyperdense vessel or unexpected calcification. Skull: No evidence for fracture. No worrisome lytic or sclerotic lesion. Sinuses/Orbits: The visualized paranasal sinuses and mastoid air cells are clear. Visualized portions of the globes and intraorbital fat are unremarkable. Other: None. IMPRESSION: 1. Areas of apparent encephalomalacia in the posterior right frontal lobe and right occipital lobe suggest prior infarct. While these changes are probably chronic given the appearance on CT, MRI would be a more definitive means to evaluate. 2. Bilateral lacunar infarcts in the basal ganglia. Electronically Signed   By: Misty Stanley M.D.   On: 03/12/2017 16:23   Mr Brain Wo Contrast  Result Date: 03/12/2017 CLINICAL DATA:  Acute presentation with diminished mental status. Responsive June are can. Old strokes. EXAM: MRI HEAD WITHOUT CONTRAST  TECHNIQUE: Multiplanar, multiecho pulse sequences of the brain and surrounding structures were obtained without intravenous contrast. COMPARISON:  Head CT same day FINDINGS: Brain: Diffusion imaging does not show any acute or subacute infarction. There are mild chronic small-vessel ischemic changes of the pons. No focal cerebellar insult. There is old infarction in the right occipital lobe with atrophy, encephalomalacia and adjacent gliosis. There is old infarction in the right frontal operculum with atrophy, encephalomalacia and adjacent gliosis. Some changes of laminar necrosis related to the old infarction. There are old small vessel infarctions within the thalami and basal ganglia. There are mild chronic small-vessel ischemic changes of the white matter bilaterally. No evidence of mass lesion, hydrocephalus or extra-axial collection. Vascular: Major vessels at the base of the brain show flow. Skull and upper cervical spine: Negative Sinuses/Orbits: Clear/normal Other: None significant IMPRESSION: No acute finding by MRI. Old infarctions in the right frontal operculum and right occipital lobe which have progressed to atrophy,  encephalomalacia gliosis. Old small vessel infarctions within the basal ganglia, thalami and hemispheric white matter. Electronically Signed   By: Nelson Chimes M.D.   On: 03/12/2017 19:33   Dg Chest Port 1 View  Result Date: 03/12/2017 CLINICAL DATA:  Congestive heart failure EXAM: PORTABLE CHEST 1 VIEW COMPARISON:  04/23/2015 FINDINGS: Cardiomediastinal silhouette is stable. No infiltrate or pleural effusion. No pulmonary edema. Status post median sternotomy. IMPRESSION: No active disease. Electronically Signed   By: Lahoma Crocker M.D.   On: 03/12/2017 16:52    EKG:   Orders placed or performed during the hospital encounter of 03/12/17  . EKG 12-Lead  . EKG 12-Lead   Normal sinus rhythm 70 bpm  IMPRESSION AND PLAN:   #32.66 year old male with altered mental status likely  secondary to polypharmacy and the also possible worsening of his dementia. Patient medicines Depakote has been increased recently by neurologist from 250 mg to 500 mg by mouth twice a day and at the same time Seroquel has been increased from 50 mg 100 mg daily and the this happened less than 1 week. So I am using Depakote to 250 twice a day, Seroquel to 50 mg daily. #2. hepatic encephalopathy with elevated ammonia likely secondary to Depakote: Continue lactulose, check ammonia level tomorrow. #3/acute on chronic renal failure with CK distress 3: Continue gentle hydration #4 history of cryptogenic strokes: Patient followed by Dr. Lorenda Parks ,he is on aspirin, and Eliquis, according to family patient needs 5 dayEEG monitoring to evaluate for episodes of unresponsiveness to see if he is having subclinical seizure, consult neurology while he is here, order EEG.     All the records are reviewed and case discussed with ED provider. Management plans discussed with the patient, family and they are in agreement.  CODE STATUS: DNR(confirmed with daughter)  TOTAL TIME TAKING CARE OF THIS PATIENT: 55 minutes.    Epifanio Lesches M.D on 03/12/2017 at 8:40 PM  Between 7am to 6pm - Pager - [phone]  After 6pm go to [email or web address] - password [email or web address] [facility]  Office  [phone]  CC: Primary care physician; Ricky Parks  Note: This dictation was prepared with Dragon dictation along with smaller phrase technology. Any transcriptional errors that result from this process are unintentional.

## 2017-03-12 NOTE — ED Triage Notes (Signed)
Pt arrived to ED via EMS after being found lethargic in living room with snoring resp. Per EMS. Pt deneis having taken drugs or excess of prescribed medication. EMS reports pt had pinpoint pupils upon their arrival. EMS administered 4 mg Narcan and 500 cc NS. EMS reports pt woke slightly after this and is now responsive when asked questions. Pt is falling asleep while answering questions but is able to answer all questions at this time. Pts mumbling and is hard to understand.   Hx of stroke and seizures.   Pt has CBG of 467. Unknown normal CBG readings.

## 2017-03-12 NOTE — ED Notes (Signed)
Pt provided dinner tray and diet sprit. Pharmacy tech at bedside with pt and patients family.

## 2017-03-12 NOTE — ED Notes (Signed)
RN called MRI to see if they are still in house. No answer. Will call on call tech.

## 2017-03-12 NOTE — ED Provider Notes (Signed)
[facility] Emergency Department Provider Note    First MD Initiated Contact with Patient 03/12/17 1542     (approximate)  I have reviewed the triage vital signs and the nursing notes.   HISTORY  Chief Complaint Altered Mental Status    HPI Vardaan Depascale is a 66 y.o. male with a history of CAD as well as diabetes as well as reported history of "6 strokes in the past year" as well as history of seizures presents with altered mental status and drowsiness. Patient found in the living room with snoring respirations and this afternoon. The patient from out of town visiting family. Did have some response to 4 mg of Narcan intranasally.  States he feels lightheaded. Denies any drug use but EMS did no response to Narcan. Denies any fevers. No nausea or vomiting. No numbness or tingling. Denies any headache. Denies any chest pain. No shortness of breath.  ECHO 01/09/17   Left Ventricle  Technical quality of this study precludes accurate assessment for wall  motion abnormalities.  Left ventricular size is normal.  Overall left ventricular systolic function is low-normal with an EF  between 50-55% %.   Right Ventricle  The right ventricle was not clearly visualized .   Left Atrium  The left atrium is upper limits of normal.  Agitated saline (bubble) study attempted -- NON Diagnostic   Right Atrium  Right Atrium is not clearly visualized.   Aortic Valve  The aortic valve leaflets were not well visualized.  No evidence of aortic valve regurgitation .  No evidence of aortic valve stenosis.   Mitral Valve  Mild thickening of the mitral valve leaflets.  There is trace mitral valve regurgitation.  No evidence of mitral valve stenosis.   Tricuspid Valve  Tricuspid valve was not well visualized.  Trace tricuspid valve regurgitation is noted.  No evidence of tricuspid stenosis.   Pulmonic Valve  The pulmonic valve was not well visualized .   Great Vessels  Aortic root dimension within normal limits.  The IVC was not visualized.  Doppler of the pulmonary veins shows normal flow .   Pericardial Effusion  Small pericardial effusion near right ventricle.  MRI Brain W/WO  FINDINGS: There is no interval change. Two areas of chronic cortical infarct involving right frontal and occipital lobe with encephalomalacia and gliosis again noted. Tiny chronic lacunar infarct left basal ganglia at margin of corona radiata unchanged as well.  I do not see any new infarct or any hemorrhage or midline shift. Ventricles remain normal in size.  Moderate white matter signal changes involve cerebral hemispheres with a small chronic infarct of corpus callosum stable as well.  The post gadolinium axial and coronal sequences show mild enhancement associated with the right occipital lobe infarct, not uncommon with posterior circulation infarcts. This can be seen for a period of months after an infarct    Past Medical History:  Diagnosis Date  . Cancer ([place])   . Coronary artery disease   . Diabetes mellitus without complication ([place])   . Hypercholesteremia   . Hypertension    History reviewed. No pertinent family history. Past Surgical History:  Procedure Laterality Date  . CORONARY ARTERY BYPASS GRAFT     Patient Active Problem List   Diagnosis Date Noted  . Altered mental status 03/12/2017      Prior to Admission medications   Medication Sig Start Date End Date Taking? Authorizing Provider  apixaban (ELIQUIS) 5 MG TABS tablet Take 5  mg by mouth 2 (two) times daily.   Yes Historical Provider, MD  aspirin 325 MG tablet Take 325 mg by mouth daily.   Yes Historical Provider, MD  B Complex-C (SUPER B COMPLEX PO) Take by mouth.   Yes Historical Provider, MD  butalbital-acetaminophen-caffeine (FIORICET, ESGIC) 50-325-40 MG tablet Take by mouth 2 (two) times daily as needed for headache.   Yes Historical Provider, MD  carvedilol (COREG) 3.125 MG tablet  Take 3.125 mg by mouth 2 (two) times daily with a meal.   Yes Historical Provider, MD  Cholecalciferol (VITAMIN D3) 1000 UNITS CAPS Take 4 capsules by mouth daily.    Yes Historical Provider, MD  colesevelam (WELCHOL) 625 MG tablet Take 1,875 mg by mouth 2 (two) times daily with a meal.   Yes Historical Provider, MD  divalproex (DEPAKOTE) 250 MG DR tablet Take 250 mg by mouth 2 (two) times daily.   Yes Historical Provider, MD  DULoxetine (CYMBALTA) 30 MG capsule Take 30-60 mg by mouth daily. 60mg  AM & 30mg  PM   Yes Historical Provider, MD  fenofibrate (TRICOR) 145 MG tablet Take 145 mg by mouth daily.   Yes Historical Provider, MD  fluticasone (FLONASE) 50 MCG/ACT nasal spray Place 2 sprays into both nostrils daily as needed.  01/26/15  Yes Historical Provider, MD  gabapentin (NEURONTIN) 300 MG capsule Take 300 mg by mouth 3 (three) times daily.   Yes Historical Provider, MD  HUMALOG KWIKPEN 100 UNIT/ML KiwkPen Inject 32 Units into the skin 3 (three) times daily.  02/19/17  Yes Historical Provider, MD  isosorbide dinitrate (ISORDIL) 30 MG tablet Take 30 mg by mouth 4 (four) times daily.   Yes Historical Provider, MD  losartan (COZAAR) 25 MG tablet Take 25 mg by mouth daily.   Yes Historical Provider, MD  memantine (NAMENDA) 5 MG tablet Take 5 mg by mouth 2 (two) times daily.   Yes Historical Provider, MD  omega-3 acid ethyl esters (LOVAZA) 1 g capsule Take 1 g by mouth daily.   Yes Historical Provider, MD  QUEtiapine (SEROQUEL) 100 MG tablet Take 100 mg by mouth at bedtime.    Yes Historical Provider, MD  TRESIBA FLEXTOUCH 200 UNIT/ML SOPN Inject 54 Units into the skin daily.  02/25/17  Yes Historical Provider, MD  VICTOZA 18 MG/3ML SOPN Inject 1.8 mg into the skin daily.  02/19/17  Yes Historical Provider, MD  citalopram (CELEXA) 40 MG tablet Take 1 tablet by mouth daily.    Historical Provider, MD  cyanocobalamin 500 MCG tablet Take 2 tablets by mouth daily.    Historical Provider, MD  famotidine  (PEPCID) 20 MG tablet Take 1 tablet by mouth 2 (two) times daily. For 30 days 04/11/15 05/11/15  Historical Provider, MD  ferrous sulfate 325 (65 FE) MG tablet Take 1 tablet by mouth 3 (three) times daily. For 30 days 04/11/15 05/11/15  Historical Provider, MD  insulin aspart (NOVOLOG) 100 UNIT/ML injection For Blood Glucose (mg/dL):Less than 70 = Follow Hypoglycemia Protocol70 - 119 = No Insulin120-149 = 1 unit 150-199 = 3 Units200-249 = 5 Units250-299 = 7 Units300-349 = 8 UnitsGreater than 349 = 8 units, recheck glucose in 2 hours and call MD if results greater then 349. **HIGH ALERT MED**  Independent double check required.   This is a fast-acting insulin and must be administered with food or source of glucose. 04/11/15   Historical Provider, MD  LANTUS SOLOSTAR 100 UNIT/ML Solostar Pen Inject 32 Units into the skin daily. 03/21/15  Historical Provider, MD  Multiple Vitamin (MULTI-VITAMINS) TABS Take 1 tablet by mouth daily.    Historical Provider, MD  Omega-3 Fatty Acids (FISH OIL) 1000 MG CAPS Take 4 capsules by mouth daily.    Historical Provider, MD  oxyCODONE-acetaminophen (PERCOCET/ROXICET) 5-325 MG per tablet Take 1-2 tablets by mouth every 4 (four) hours as needed. 04/11/15   Historical Provider, MD  pravastatin (PRAVACHOL) 80 MG tablet Take 1 tablet by mouth daily. 04/11/15   Historical Provider, MD    Allergies Patient has no known allergies.    Social History Social History  Substance Use Topics  . Smoking status: Never Smoker  . Smokeless tobacco: Never Used  . Alcohol use No    Review of Systems Patient denies headaches, rhinorrhea, blurry vision, numbness, shortness of breath, chest pain, edema, cough, abdominal pain, nausea, vomiting, diarrhea, dysuria, fevers, rashes or hallucinations unless otherwise stated above in HPI. ____________________________________________   PHYSICAL EXAM:  VITAL SIGNS: Vitals:   03/12/17 2224 03/12/17 2300  BP: (!) 156/83 (!) 161/97  Pulse: 71  74  Resp: 16 (!) 22  Temp:  98.6 F (37 C)    Constitutional: drowsy, encephalopathic, will mumble and respond to voice but difficult to understand Eyes: Conjunctivae are normal. PERRL. EOMI. Head: Atraumatic. Nose: No congestion/rhinnorhea. Mouth/Throat: Mucous membranes are moist.  Oropharynx non-erythematous. Neck: No stridor. Painless ROM. No cervical spine tenderness to palpation Hematological/Lymphatic/Immunilogical: No cervical lymphadenopathy. Cardiovascular: Normal rate, regular rhythm. Grossly normal heart sounds.  Good peripheral circulation. Respiratory: Normal respiratory effort.  No retractions. Lungs CTAB. Gastrointestinal: Soft and nontender. No distention. No abdominal bruits. No CVA tenderness. Musculoskeletal: No lower extremity tenderness nor edema.  No joint effusions. Neurologic:  Mumbled soft speech, MAE with commands, no facial droop Skin:  Skin is warm, dry and intact. No rash noted.  ____________________________________________   LABS (all labs ordered are listed, but only abnormal results are displayed)  Results for orders placed or performed during the hospital encounter of 03/12/17 (from the past 24 hour(s))  Glucose, capillary     Status: Abnormal   Collection Time: 03/12/17  4:04 PM  Result Value Ref Range   Glucose-Capillary 332 (H) 65 - 99 mg/dL  Comprehensive metabolic panel     Status: Abnormal   Collection Time: 03/12/17  4:24 PM  Result Value Ref Range   Sodium 133 (L) 135 - 145 mmol/L   Potassium 4.7 3.5 - 5.1 mmol/L   Chloride 102 101 - 111 mmol/L   CO2 24 22 - 32 mmol/L   Glucose, Bld 317 (H) 65 - 99 mg/dL   BUN 31 (H) 6 - 20 mg/dL   Creatinine, Ser 1.85 (H) 0.61 - 1.24 mg/dL   Calcium 8.8 (L) 8.9 - 10.3 mg/dL   Total Protein 6.4 (L) 6.5 - 8.1 g/dL   Albumin 3.2 (L) 3.5 - 5.0 g/dL   AST 26 15 - 41 U/L   ALT 18 17 - 63 U/L   Alkaline Phosphatase 34 (L) 38 - 126 U/L   Total Bilirubin 0.5 0.3 - 1.2 mg/dL   GFR calc non Af Amer 37 (L)  >60 mL/min   GFR calc Af Amer 43 (L) >60 mL/min   Anion gap 7 5 - 15  CBC     Status: Abnormal   Collection Time: 03/12/17  4:24 PM  Result Value Ref Range   WBC 5.1 3.8 - 10.6 K/uL   RBC 4.21 (L) 4.40 - 5.90 MIL/uL   Hemoglobin 12.7 (L) 13.0 - 18.0  g/dL   HCT 36.9 (L) 40.0 - 52.0 %   MCV 87.5 80.0 - 100.0 fL   MCH 30.0 26.0 - 34.0 pg   MCHC 34.3 32.0 - 36.0 g/dL   RDW 14.9 (H) 11.5 - 14.5 %   Platelets 226 150 - 440 K/uL  Ammonia     Status: Abnormal   Collection Time: 03/12/17  4:24 PM  Result Value Ref Range   Ammonia 51 (H) 9 - 35 umol/L  Valproic acid level     Status: Abnormal   Collection Time: 03/12/17  4:24 PM  Result Value Ref Range   Valproic Acid Lvl <10 (L) 50.0 - 100.0 ug/mL  Troponin I     Status: None   Collection Time: 03/12/17  4:24 PM  Result Value Ref Range   Troponin I <0.03 <0.03 ng/mL  Blood gas, venous     Status: Abnormal   Collection Time: 03/12/17  4:24 PM  Result Value Ref Range   pH, Ven 7.34 7.250 - 7.430   pCO2, Ven 48 44.0 - 60.0 mmHg   pO2, Ven 56.0 (H) 32.0 - 45.0 mmHg   Bicarbonate 25.9 20.0 - 28.0 mmol/L   Acid-base deficit 0.4 0.0 - 2.0 mmol/L   O2 Saturation 86.7 %   Patient temperature 37.0    Collection site VEIN    Sample type VENIPUNCTURE   Urine Drug Screen, Qualitative ([place] only)     Status: Abnormal   Collection Time: 03/12/17  6:14 PM  Result Value Ref Range   Tricyclic, Ur Screen POSITIVE (A) NONE DETECTED   Amphetamines, Ur Screen NONE DETECTED NONE DETECTED   MDMA (Ecstasy)Ur Screen NONE DETECTED NONE DETECTED   Cocaine Metabolite,Ur [place] NONE DETECTED NONE DETECTED   Opiate, Ur Screen NONE DETECTED NONE DETECTED   Phencyclidine (PCP) Ur S NONE DETECTED NONE DETECTED   Cannabinoid 50 Ng, Ur [place] NONE DETECTED NONE DETECTED   Barbiturates, Ur Screen NONE DETECTED NONE DETECTED   Benzodiazepine, Ur Scrn NONE DETECTED NONE DETECTED   Methadone Scn, Ur NONE DETECTED NONE DETECTED  Urinalysis, Complete w Microscopic      Status: Abnormal   Collection Time: 03/12/17  6:14 PM  Result Value Ref Range   Color, Urine YELLOW (A) YELLOW   APPearance CLEAR (A) CLEAR   Specific Gravity, Urine 1.020 1.005 - 1.030   pH 6.0 5.0 - 8.0   Glucose, UA >=500 (A) NEGATIVE mg/dL   Hgb urine dipstick NEGATIVE NEGATIVE   Bilirubin Urine NEGATIVE NEGATIVE   Ketones, ur NEGATIVE NEGATIVE mg/dL   Protein, ur 30 (A) NEGATIVE mg/dL   Nitrite NEGATIVE NEGATIVE   Leukocytes, UA NEGATIVE NEGATIVE   RBC / HPF NONE SEEN 0 - 5 RBC/hpf   WBC, UA 0-5 0 - 5 WBC/hpf   Bacteria, UA NONE SEEN NONE SEEN   Squamous Epithelial / LPF NONE SEEN NONE SEEN   Mucous PRESENT    ____________________________________________  EKG My review and personal interpretation at Time: 16:04   Indication: ams  Rate: 70  Rhythm: sinus Axis: left Other: poor r wave progression, no stemi ____________________________________________  RADIOLOGY  I personally reviewed all radiographic images ordered to evaluate for the above acute complaints and reviewed radiology reports and findings.  These findings were personally discussed with the patient.  Please see medical record for radiology report.  ____________________________________________   PROCEDURES  Procedure(s) performed:  Procedures    Critical Care performed: no ____________________________________________   INITIAL IMPRESSION / ASSESSMENT AND PLAN / ED COURSE  Pertinent labs & imaging results that were available during my care of the patient were reviewed by me and considered in my medical decision making (see chart for details).  DDX: Dehydration, sepsis, pna, uti, hypoglycemia, cva, drug effect, withdrawal, encephalitis   Ricky Parks is a 66 y.o. who presents to the ED with Acute encephalopathy as described above. Patient with very bizarre presentation. CT imaging ordered due to concern for stroke shows no evidence of acute abnormality.  Glucose is normal. We'll check labs to evaluate  for which led abnormality. May be having component of subclinical seizures. Patient also with multiple sedating medications for her psychiatric illness which could be contributing.  The patient will be placed on continuous pulse oximetry and telemetry for monitoring.  Laboratory evaluation will be sent to evaluate for the above complaints.     Clinical Course as of Mar 12 2312  Tue Mar 12, 2017  1637 Patient still drowsy. Response to painful stimuli. Provide history. Denies any numbness or tingling. No response to Narcan. Pressure was measured to be low at home 80/60. Blood pressure now is 120/70. ET head shows no evidence of bleed.  [PR]  8325 Patient also sitting up for chest x-ray. Eyes open. Interacting appropriately.  BP remains stable.  No leukocytosis.  Base on his history of strokes, will order MRI to further characterize.  [PR]  1756 Patient again drowsy. will re dose of Narcan. Has mildly elevated ammonia but LFTs appear to be at baseline. Renal function also baseline.  Does not appear to have any active seizures as he does respond to painful stimuli. I do suspect this may be due to chronic valproic acid overdose. He does not have any lateralizing weakness to suggest acute ischemia or cva at this time.   [PR]  1850 No effect of narcan.  Will start lactulose for hyperammonemia.   [PR]    Clinical Course User Index [PR] Merlyn Lot, MD     ____________________________________________   FINAL CLINICAL IMPRESSION(S) / ED DIAGNOSES  Final diagnoses:  Acute encephalopathy  Hyperammonemia ([place])  Confusion      NEW MEDICATIONS STARTED DURING THIS VISIT:  Current Discharge Medication List       Note:  This document was prepared using Dragon voice recognition software and may include unintentional dictation errors.    Merlyn Lot, MD 03/12/17 [phone]

## 2017-03-13 ENCOUNTER — Inpatient Hospital Stay: Payer: 59

## 2017-03-13 DIAGNOSIS — K7290 Hepatic failure, unspecified without coma: Secondary | ICD-10-CM

## 2017-03-13 LAB — CBC
HEMATOCRIT: 41.1 % (ref 40.0–52.0)
HEMOGLOBIN: 14.1 g/dL (ref 13.0–18.0)
MCH: 30.4 pg (ref 26.0–34.0)
MCHC: 34.2 g/dL (ref 32.0–36.0)
MCV: 88.7 fL (ref 80.0–100.0)
Platelets: 210 10*3/uL (ref 150–440)
RBC: 4.63 MIL/uL (ref 4.40–5.90)
RDW: 15.1 % — ABNORMAL HIGH (ref 11.5–14.5)
WBC: 5 10*3/uL (ref 3.8–10.6)

## 2017-03-13 LAB — BASIC METABOLIC PANEL
ANION GAP: 7 (ref 5–15)
BUN: 27 mg/dL — ABNORMAL HIGH (ref 6–20)
CALCIUM: 8.9 mg/dL (ref 8.9–10.3)
CO2: 25 mmol/L (ref 22–32)
Chloride: 103 mmol/L (ref 101–111)
Creatinine, Ser: 1.6 mg/dL — ABNORMAL HIGH (ref 0.61–1.24)
GFR, EST AFRICAN AMERICAN: 51 mL/min — AB (ref >60)
GFR, EST NON AFRICAN AMERICAN: 44 mL/min — AB (ref >60)
Glucose, Bld: 291 mg/dL — ABNORMAL HIGH (ref 65–99)
POTASSIUM: 4.5 mmol/L (ref 3.5–5.1)
Sodium: 135 mmol/L (ref 135–145)

## 2017-03-13 LAB — GLUCOSE, CAPILLARY
GLUCOSE-CAPILLARY: 305 mg/dL — AB (ref 65–99)
GLUCOSE-CAPILLARY: 340 mg/dL — AB (ref 65–99)
GLUCOSE-CAPILLARY: 299 mg/dL — AB (ref 65–99)
GLUCOSE-CAPILLARY: 228 mg/dL — AB (ref 65–99)
GLUCOSE-CAPILLARY: 297 mg/dL — AB (ref 65–99)

## 2017-03-13 LAB — AMMONIA: Ammonia: 30 umol/L (ref 9–35)

## 2017-03-13 MED ORDER — GABAPENTIN 300 MG PO CAPS
600.0000 mg | ORAL_CAPSULE | Freq: Three times a day (TID) | ORAL | Status: DC
  Administered 2017-03-13 – 2017-03-14 (×2): 600 mg via ORAL
  Filled 2017-03-13 (×2): qty 2

## 2017-03-13 MED ORDER — GABAPENTIN 300 MG PO CAPS: 600.0000 mg | ORAL_CAPSULE | Freq: Three times a day (TID) | ORAL | Status: DC

## 2017-03-13 MED ORDER — DIVALPROEX SODIUM 250 MG PO DR TAB
375.0000 mg | DELAYED_RELEASE_TABLET | Freq: Two times a day (BID) | ORAL | Status: DC
  Administered 2017-03-13 – 2017-03-14 (×2): 375 mg via ORAL
  Filled 2017-03-13 (×5): qty 1

## 2017-03-13 MED ORDER — DIVALPROEX SODIUM 125 MG PO DR TAB: 375.0000 mg | DELAYED_RELEASE_TABLET | Freq: Two times a day (BID) | ORAL | Status: DC

## 2017-03-13 NOTE — Progress Notes (Signed)
[place] [facility] [place] NAME: Ricky Parks    MR#:  [id number]  DATE OF BIRTH:  1951-09-02  SUBJECTIVE:  CHIEF COMPLAINT:   Chief Complaint  Patient presents with  . Altered Mental Status   As per family who is present in the room, patient have multiple strokes in last 1 year and he is following with an urologist in [place], also have seizures and he is on medications for that. For last few month he has gradual worsening in his mental status, he is remaining mostly sleepy and confused.[name] He is visiting at [place] and was almost not arousable so family called EMS to bring him to hospital. Patient is arousable easily but confused so not able to give me any details.\  REVIEW OF SYSTEMS:   Patient is not able to give a review of system. ROS  DRUG ALLERGIES:  No Known Allergies  VITALS:  Blood pressure (!) 165/80, pulse 78, temperature 98.1 F (36.7 C), temperature source Oral, resp. rate 18, height 6' (1.829 m), weight 109.8 kg (242 lb), SpO2 96 %.  PHYSICAL EXAMINATION:  GENERAL:  66 y.o.-year-old patient lying in the bed with no acute distress.  EYES: Pupils equal, round, reactive to light and accommodation. No scleral icterus. Extraocular muscles intact.  HEENT: Head atraumatic, normocephalic. Oropharynx and nasopharynx clear.  NECK:  Supple, no jugular venous distention. No thyroid enlargement, no tenderness.  LUNGS: Normal breath sounds bilaterally, no wheezing, rales,rhonchi or crepitation. No use of accessory muscles of respiration.  CARDIOVASCULAR: S1, S2 normal. No murmurs, rubs, or gallops.  ABDOMEN: Soft, nontender, nondistended. Bowel sounds present. No organomegaly or mass.  EXTREMITIES: No pedal edema, cyanosis, or clubbing.  NEUROLOGIC: Cranial nerves II through XII are intact. Muscle strength 5/5 in all extremities. Sensation intact. Gait not checked.  PSYCHIATRIC: The patient is alert and oriented x 0.  SKIN: No  obvious rash, lesion, or ulcer.   Physical Exam LABORATORY PANEL:   CBC  Recent Labs Lab 03/13/17 0444  WBC 5.0  HGB 14.1  HCT 41.1  PLT 210   ------------------------------------------------------------------------------------------------------------------  Chemistries   Recent Labs Lab 03/12/17 1624 03/13/17 0444  NA 133* 135  K 4.7 4.5  CL 102 103  CO2 24 25  GLUCOSE 317* 291*  BUN 31* 27*  CREATININE 1.85* 1.60*  CALCIUM 8.8* 8.9  AST 26  --   ALT 18  --   ALKPHOS 34*  --   BILITOT 0.5  --    ------------------------------------------------------------------------------------------------------------------  Cardiac Enzymes  Recent Labs Lab 03/12/17 1624  TROPONINI <0.03   ------------------------------------------------------------------------------------------------------------------  RADIOLOGY:  Ct Head Wo Contrast  Result Date: 03/12/2017 CLINICAL DATA:  Lethargic with decreased alertness. EXAM: CT HEAD WITHOUT CONTRAST TECHNIQUE: Contiguous axial images were obtained from the base of the skull through the vertex without intravenous contrast. COMPARISON:  None. FINDINGS: Brain: Hypo attenuation is identified in the posterior right frontal region, likely related to previous infarct given a degree of overlying ex vacuo dilatation right lateral ventricle. Low density in the right occipital lobe suggests prior right PCA territory infarct. Lacunar infarction noted in the basal ganglia bilaterally. No evidence for acute hemorrhage, hydrocephalus, mass lesion, or abnormal extra-axial fluid collection. Vascular: No hyperdense vessel or unexpected calcification. Skull: No evidence for fracture. No worrisome lytic or sclerotic lesion. Sinuses/Orbits: The visualized paranasal sinuses and mastoid air cells are clear. Visualized portions of the globes and intraorbital fat are unremarkable. Other: None. IMPRESSION: 1. Areas of apparent  encephalomalacia in the posterior right  frontal lobe and right occipital lobe suggest prior infarct. While these changes are probably chronic given the appearance on CT, MRI would be a more definitive means to evaluate. 2. Bilateral lacunar infarcts in the basal ganglia. Electronically Signed   By: Misty Stanley M.D.   On: 03/12/2017 16:23   Mr Brain Wo Contrast  Result Date: 03/12/2017 CLINICAL DATA:  Acute presentation with diminished mental status. Responsive June are can. Old strokes. EXAM: MRI HEAD WITHOUT CONTRAST TECHNIQUE: Multiplanar, multiecho pulse sequences of the brain and surrounding structures were obtained without intravenous contrast. COMPARISON:  Head CT same day FINDINGS: Brain: Diffusion imaging does not show any acute or subacute infarction. There are mild chronic small-vessel ischemic changes of the pons. No focal cerebellar insult. There is old infarction in the right occipital lobe with atrophy, encephalomalacia and adjacent gliosis. There is old infarction in the right frontal operculum with atrophy, encephalomalacia and adjacent gliosis. Some changes of laminar necrosis related to the old infarction. There are old small vessel infarctions within the thalami and basal ganglia. There are mild chronic small-vessel ischemic changes of the white matter bilaterally. No evidence of mass lesion, hydrocephalus or extra-axial collection. Vascular: Major vessels at the base of the brain show flow. Skull and upper cervical spine: Negative Sinuses/Orbits: Clear/normal Other: None significant IMPRESSION: No acute finding by MRI. Old infarctions in the right frontal operculum and right occipital lobe which have progressed to atrophy, encephalomalacia gliosis. Old small vessel infarctions within the basal ganglia, thalami and hemispheric white matter. Electronically Signed   By: Nelson Chimes M.D.   On: 03/12/2017 19:33   Dg Chest Port 1 View  Result Date: 03/12/2017 CLINICAL DATA:  Congestive heart failure EXAM: PORTABLE CHEST 1 VIEW  COMPARISON:  04/23/2015 FINDINGS: Cardiomediastinal silhouette is stable. No infiltrate or pleural effusion. No pulmonary edema. Status post median sternotomy. IMPRESSION: No active disease. Electronically Signed   By: Lahoma Crocker M.D.   On: 03/12/2017 16:52   US Abdomen Limited Ruq  Result Date: 03/12/2017 CLINICAL DATA:  Acute onset of confusion. Hepatic encephalopathy. Initial encounter. EXAM: US ABDOMEN LIMITED - RIGHT UPPER QUADRANT COMPARISON:  CT of the abdomen and pelvis performed 04/23/2015 FINDINGS: Gallbladder: Status post cholecystectomy.  No retained stones seen. Common bile duct: Diameter: 0.6 cm, within normal limits in caliber. Liver: No focal lesions seen. Diffusely increased parenchymal echogenicity likely reflects fatty infiltration. IMPRESSION: 1. No acute abnormality seen at the right upper quadrant. 2. Status post cholecystectomy. 3. Diffuse fatty infiltration within the liver. Electronically Signed   By: Garald Balding M.D.   On: 03/12/2017 22:30    ASSESSMENT AND PLAN:   Active Problems:   Altered mental status  #1  altered mental status   likely secondary to polypharmacy and the also possible worsening of his dementia. Patient medicines Depakote has been increased recently by neurologist from 250 mg to 500 mg by mouth twice a day and at the same time Seroquel has been increased from 50 mg 100 mg daily and the this happened less than 1 week. So I am using Depakote to 250 twice a day, Seroquel to 50 mg daily.   He follows regularly at neurologist office and also had EEG done as outpatient.   We'll wait for neurology consult for further help. #2. hepatic encephalopathy with elevated ammonia likely secondary to Depakote: Continue lactulose,   Ammonia level came down. #3 acute on chronic renal failure with CK distress 3: Continue gentle hydration #  4 history of cryptogenic strokes: Patient followed by Dr. Lorenda Cahill ,he is on aspirin, and Eliquis, according to family patient  needs 5 dayEEG monitoring to evaluate for episodes of unresponsiveness to see if he is having subclinical seizure, consult neurology while he is here, order EEG.    All the records are reviewed and case discussed with Care Management/Social Workerr. Management plans discussed with the patient, family and they are in agreement.  CODE STATUS: DO NOT RESUSCITATE  TOTAL TIME TAKING CARE OF THIS PATIENT: 35 minutes.   Discussed with patient's wife, daughter, sister-in-law present in the room.  POSSIBLE D/C IN 1-2 DAYS, DEPENDING ON CLINICAL CONDITION.   Vaughan Basta M.D on 03/13/2017   Between 7am to 6pm - Pager - [phone]  After 6pm go to [email or web address] - password [email or web address] [email or web address]  Office  [phone]  CC: Primary care physician; Pcp Not In System  Note: This dictation was prepared with Dragon dictation along with smaller phrase technology. Any transcriptional errors that result from this process are unintentional.

## 2017-03-13 NOTE — Evaluation (Signed)
Occupational Therapy Evaluation Patient Details Name: Ricky Parks MRN: [id number] DOB: September 29, 1951 Today's Date: 03/13/2017    History of Present Illness Pt is a 66 y.o. male who had an episode on yesterday when he became confused.  Was pale.  Speech was slurred. Also had problems with coordination as well.  Patient was brought in by EMS at that time.  Patient appears back to baseline today.  Per wife his cognitive function has steadily declined over the past year.  He hallucinates often.  He has had many strokes, she reports 11.   Clinical Impression   Pt seen for OT evaluation this date. Pt required significant assistance for ADL, IADL at baseline due to cognitive impairments from previous strokes. Family reports having to provide extensive physical assist for self care tasks "because he won't help you at all." When assessed and prompted, pt will perform bed mobility with additional time and effort to perform and Min assist to come supine>EOB; Min guard for toilet transfer to regular commode and verbal cues for hand placement; able to stand at the sink to wash hands with supervision. When asked, pt reports "harder to keep my focus on people/things on my left side" and with verbal cues from OT will turn head and attend more to L side. Family report at least 6-7 falls in past 12 months, particularly since recent medication changes that made him extremely drowsy and "if he's not in bed before he takes them, then he's going to fall." When asked situational safety questions, pt able to respond appropriately, however concern for overall safety awareness. Pt would benefit from [facility] OT, PT, and SLP to address balance, falls prevention, cognitive compensatory strategies, family/caregiver education, and home safety in order to minimize risk of future falls, injury, and rehospitalization as well as maximize functional independence and decrease overall caregiver burden.     Follow Up Recommendations  Home health  OT;Other (comment) (HH PT, HH SLP)    Equipment Recommendations  None recommended by OT    Recommendations for Other Services Speech consult     Precautions / Restrictions Precautions Precautions: Fall Restrictions Weight Bearing Restrictions: No      Mobility Bed Mobility Overal bed mobility: Needs Assistance Bed Mobility: Supine to Sit     Supine to sit: Min assist     General bed mobility comments: increased time, and handheld assist to pull up to sit EOB  Transfers Overall transfer level: Needs assistance Equipment used: Rolling walker (2 wheeled) Transfers: Sit to/from Stand Sit to Stand: Min guard         General transfer comment: verbal cues for hand placement to maximize safety    Balance Overall balance assessment: History of Falls;Needs assistance Sitting-balance support: Bilateral upper extremity supported;Feet supported Sitting balance-Leahy Scale: Good     Standing balance support: Bilateral upper extremity supported;During functional activity Standing balance-Leahy Scale: Good                             ADL either performed or assessed with clinical judgement   ADL Overall ADL's : Needs assistance/impaired                         Toilet Transfer: RW;Min guard;Cueing for Administrator, sports Details (indicate cue type and reason): verbal cues for hand placement to maximize safety           General ADL Comments: Pt generally at [employer]  A for LB ADL, however, this is significantly better than at baseline per family report ("he can do it but just doesn't help")     Vision Patient Visual Report: Peripheral vision impairment (impaired L eye peripheral vision) Vision Assessment?: Yes Eye Alignment: Within Functional Limits Ocular Range of Motion: Within Functional Limits Alignment/Gaze Preference: Within Defined Limits Tracking/Visual Pursuits: Impaired - to be further tested in functional context  (decreased smoothness of L eye with horizontal tracking) Convergence: Impaired - to be further tested in functional context Visual Fields: No apparent deficits Additional Comments: pt reports blurry vision, but able to read various font sizes from distance of >5 ft when asked     Perception     Praxis Praxis Praxis tested?: Within functional limits    Pertinent Vitals/Pain Pain Assessment: No/denies pain     Hand Dominance Right   Extremity/Trunk Assessment Upper Extremity Assessment Upper Extremity Assessment: Overall WFL for tasks assessed   Lower Extremity Assessment Lower Extremity Assessment: Defer to PT evaluation;Overall [facility] for tasks assessed   Cervical / Trunk Assessment Cervical / Trunk Assessment: Normal   Communication Communication Communication: No difficulties   Cognition Arousal/Alertness: Awake/alert Behavior During Therapy: WFL for tasks assessed/performed Overall Cognitive Status: History of cognitive impairments - at baseline                                 General Comments: Cognition appears to be more impaired than at baseline, MOCA administered with score of 5/30 (>/=26 is normal) with significant impairments in visuospatial/executive functioning, memory, attention, language, abstraction, orientation, and delayed recall. Generally appropriate responses given to situational safety questions   General Comments       Exercises     Shoulder Instructions      Home Living Family/patient expects to be discharged to:: Private residence Living Arrangements: Spouse/significant other;Children Available Help at Discharge: Family;Available 24 hours/day (wife has bad back, increasingly difficult to assist ) Type of Home: House Home Access: Stairs to enter [employer] of Steps: 2 Entrance Stairs-Rails: Right Home Layout: One level     Bathroom Shower/Tub: Tub/shower unit (wife reports going to install a walk-in shower soon)    Biochemist, clinical: Handicapped height Bathroom Accessibility: No   Home Equipment: Shower seat;Grab bars - tub/shower;Hand held shower head;Other (comment);Cane - single point;Walker - 2 wheels;Bedside commode (rollator)          Prior Functioning/Environment Level of Independence: Needs assistance  Gait / Transfers Assistance Needed: pt ambulating with cane, states "but my neurologist wants me to use the rollator"; family reports extensive physical assist for transfers due to impaired cognition ADL's / Homemaking Assistance Needed: wife stated "I do about 99% of everything for him"; extensive assist reported for ADL, IADL due to impaired cognition; wife manages meds, meal prep, cleaning, etc            OT Problem List: Decreased cognition;Decreased safety awareness;Impaired balance (sitting and/or standing);Impaired vision/perception      OT Treatment/Interventions: Self-care/ADL training;Energy conservation;Patient/family education;Cognitive remediation/compensation    OT Goals(Current goals can be found in the care plan section) Acute Rehab OT Goals Patient Stated Goal: do better OT Goal Formulation: With patient/family Time For Goal Achievement: 03/27/17 Potential to Achieve Goals: Good  OT Frequency: Min 1X/week   Barriers to D/C: Decreased caregiver support  increasingly difficult for wife       Co-evaluation  End of Session Equipment Utilized During Treatment: Gait belt;Rolling walker  Activity Tolerance: Patient tolerated treatment well Patient left: in bed;with call bell/phone within reach;with family/visitor present;Other (comment) (with PT in room for evaluation)  OT Visit Diagnosis: Other abnormalities of gait and mobility (R26.89);Other symptoms and signs involving cognitive function;History of falling (Z91.81);Repeated falls (R29.6)                Time: 9937-1696 OT Time Calculation (min): 62 min Charges:  OT General Charges $OT Visit: 1  Procedure OT Evaluation $OT Eval Moderate Complexity: 1 Procedure OT Treatments $Self Care/Home Management : 38-52 mins G-Codes:     Jeni Salles, MPH, MS, OTR/L ascom [phone] 03/13/17, 5:30 PM

## 2017-03-13 NOTE — Evaluation (Signed)
Physical Therapy Evaluation Patient Details Name: Ricky Parks MRN: [id number] DOB: December 01, 1951 Today's Date: 03/13/2017   History of Present Illness  Pt is a 66 y.o. male who had an episode on yesterday when he became confused.  Was pale.  Speech was slurred. Also had problems with coordination as well.  Patient was brought in by EMS at that time.  Patient appears back to baseline today.  Per wife his cognitive function has steadily declined over the past year.  He hallucinates often.  He has had many strokes, she reports 11. Further history includes cryptogenic stroke, cancer, CAD, DM, and HTN  Clinical Impression  Pt is a pleasant 66 year old male who was admitted for AMS with slurred speech and decreased coordination. Pt confused to place/situation, however alert to self. Pt performs bed mobility with min assist, transfers with cga, and ambulation with cga and RW. Pt demonstrates deficits with balance/cognition/safety awareness. Pt is high falls risk and needs to use RW for all mobility to improve balance.During ambulation, pt having hallucinations seeing his old dog in the hallway along with family members that weren't in room. Coordination slightly impaired on B LE with foot tapping, however heel shin test WNL along with UE coordination. Would benefit from skilled PT to address above deficits and promote optimal return to PLOF. Recommend transition to [facility] upon discharge from acute hospitalization. Further recommendation for possible ALF for LTC depending on progress with HHPT.       Follow Up Recommendations Home health PT (with 24/7 assist; wants HHPT in [facility])    Equipment Recommendations  None recommended by PT    Recommendations for Other Services       Precautions / Restrictions Precautions Precautions: Fall Restrictions Weight Bearing Restrictions: No      Mobility  Bed Mobility Overal bed mobility: Needs Assistance Bed Mobility: Supine to Sit     Supine to sit:  Min assist     General bed mobility comments: increased time and effort to perform mobility. Needs physical assist for trunk mobility. Once seated at EOB, pt able to sit with upright posture  Transfers Overall transfer level: Needs assistance Equipment used: Rolling walker (2 wheeled) Transfers: Sit to/from Stand Sit to Stand: Min guard         General transfer comment: cues given for safety during transitions. Once standing, RW adjusted to pt height  Ambulation/Gait Ambulation/Gait assistance: Min guard Ambulation Distance (Feet): 70 Feet Assistive device: Rolling walker (2 wheeled) Gait Pattern/deviations: Step-through pattern;Shuffle     General Gait Details: Pt with decreased B step length and slight shuffle during ambulation. Pt reports fatigue and request to return back to room. Pt unsteady, however no formal LOB noted  Stairs            Wheelchair Mobility    Modified Rankin (Stroke Patients Only)       Balance Overall balance assessment: History of Falls;Needs assistance Sitting-balance support: Bilateral upper extremity supported;Feet supported Sitting balance-Leahy Scale: Good     Standing balance support: Bilateral upper extremity supported;During functional activity Standing balance-Leahy Scale: Good                               Pertinent Vitals/Pain Pain Assessment: No/denies pain    Home Living Family/patient expects to be discharged to:: Private residence Living Arrangements: Spouse/significant other;Children Available Help at Discharge: Family;Available 24 hours/day Type of Home: House Home Access: Stairs to enter Entrance Stairs-Rails:  Right Entrance Stairs-Number of Steps: 2 Home Layout: One level Home Equipment: Shower seat;Grab bars - tub/shower;Hand held shower head;Other (comment);Cane - single point;Walker - 2 wheels;Bedside commode      Prior Function Level of Independence: Needs assistance   Gait / Transfers  Assistance Needed: pt ambulating with cane, states "but my neurologist wants me to use the rollator"; family reports extensive physical assist for transfers due to impaired cognition. Pt reports multiple falls at home  ADL's / Homemaking Assistance Needed: wife stated "I do about 99% of everything for him"; extensive assist reported for ADL, IADL due to impaired cognition; wife manages meds, meal prep, cleaning, etc        Hand Dominance   Dominant Hand: Right    Extremity/Trunk Assessment   Upper Extremity Assessment Upper Extremity Assessment: Overall WFL for tasks assessed    Lower Extremity Assessment Lower Extremity Assessment: Overall WFL for tasks assessed    Cervical / Trunk Assessment Cervical / Trunk Assessment: Normal  Communication   Communication: No difficulties  Cognition Arousal/Alertness: Awake/alert Behavior During Therapy: WFL for tasks assessed/performed Overall Cognitive Status: History of cognitive impairments - at baseline                                 General Comments: Cognition appears to be more impaired than at baseline, MOCA administered with score of 5/30 (>/=26 is normal) with significant impairments in visuospatial/executive functioning, memory, attention, language, abstraction, orientation, and delayed recall. Generally appropriate responses given to situational safety questions      General Comments      Exercises     Assessment/Plan    PT Assessment Patient needs continued PT services  PT Problem List Decreased activity tolerance;Decreased mobility;Decreased balance;Decreased cognition;Decreased safety awareness       PT Treatment Interventions Gait training;DME instruction;Balance training    PT Goals (Current goals can be found in the Care Plan section)  Acute Rehab PT Goals Patient Stated Goal: do better PT Goal Formulation: With patient Time For Goal Achievement: 03/27/17 Potential to Achieve Goals: Good     Frequency Min 2X/week   Barriers to discharge        Co-evaluation               End of Session Equipment Utilized During Treatment: Gait belt Activity Tolerance: Patient tolerated treatment well Patient left: in bed;with bed alarm set;with family/visitor present Nurse Communication: Mobility status PT Visit Diagnosis: Unsteadiness on feet (R26.81);Other abnormalities of gait and mobility (R26.89);Difficulty in walking, not elsewhere classified (R26.2)    Time: 5400-8676 PT Time Calculation (min) (ACUTE ONLY): 16 min   Charges:   PT Evaluation $PT Eval Moderate Complexity: 1 Procedure     PT G Codes:        Ricky Parks, PT, DPT [phone]   Ricky Parks 03/13/2017, 5:33 PM

## 2017-03-13 NOTE — Consult Note (Signed)
Reason for Consult:Altered mental status Referring Physician: Anselm Jungling  CC: Altered mental status  HPI: Ricky Parks is an 66 y.o. male followed by Dr. Murvin Natal in [place] who had an episode on yesterday when he became confused.  Was pale.  Speech was slurred. Also had problems with coordination as well.  Patient was brought in by EMS at that time.  Patient appears back to baseline today.  Per wife his cognitive function has steadily declined over the past year.  He hallucinates often.  He has had many strokes, she reports 11.  Currently on Eliquis.  There has been some question of seizure activity but not confirmed.  Patient recently started on Depakote and Seroquel increased.    Past Medical History:  Diagnosis Date  . Cancer ([name])   . Coronary artery disease   . Diabetes mellitus without complication ([name])   . Hypercholesteremia   . Hypertension     Past Surgical History:  Procedure Laterality Date  . CORONARY ARTERY BYPASS GRAFT      Family history: Father with hypercholesterolemia, CAD, DM and stroke.  Mother with Alzheimer's and DM.  Brother with DM and CAD-deceased.    Social History:  reports that he has never smoked. He has never used smokeless tobacco. He reports that he does not drink alcohol or use drugs.  No Known Allergies  Medications:  I have reviewed the patient's current medications. Prior to Admission:  Prescriptions Prior to Admission  Medication Sig Dispense Refill Last Dose  . apixaban (ELIQUIS) 5 MG TABS tablet Take 5 mg by mouth 2 (two) times daily.   03/12/2017 at 1000  . aspirin 325 MG tablet Take 325 mg by mouth daily.   03/12/2017 at 1000  . B Complex-C (SUPER B COMPLEX PO) Take by mouth.   03/12/2017 at 1000  . butalbital-acetaminophen-caffeine (FIORICET, ESGIC) 50-325-40 MG tablet Take by mouth 2 (two) times daily as needed for headache.   prn at prn  . carvedilol (COREG) 3.125 MG tablet Take 3.125 mg by mouth 2 (two) times daily with a meal.   03/12/2017 at  1000  . Cholecalciferol (VITAMIN D3) 1000 UNITS CAPS Take 4 capsules by mouth daily.    03/12/2017 at 1000  . colesevelam (WELCHOL) 625 MG tablet Take 1,875 mg by mouth 2 (two) times daily with a meal.   03/12/2017 at 1000  . divalproex (DEPAKOTE) 250 MG DR tablet Take 250 mg by mouth 2 (two) times daily.   03/12/2017 at 1000  . DULoxetine (CYMBALTA) 30 MG capsule Take 30-60 mg by mouth daily. 60mg  AM & 30mg  PM   03/12/2017 at 1000  . fenofibrate (TRICOR) 145 MG tablet Take 145 mg by mouth daily.   03/12/2017 at 1000  . fluticasone (FLONASE) 50 MCG/ACT nasal spray Place 2 sprays into both nostrils daily as needed.   0 prn at prn  . gabapentin (NEURONTIN) 300 MG capsule Take 300 mg by mouth 3 (three) times daily.   03/12/2017 at 1000  . HUMALOG KWIKPEN 100 UNIT/ML KiwkPen Inject 20 Units into the skin 3 (three) times daily with meals. Plus an additional 2 units per sliding scale with meals prn   unknown at unknown  . isosorbide dinitrate (ISORDIL) 30 MG tablet Take 30 mg by mouth 4 (four) times daily.   03/12/2017 at 1000  . losartan (COZAAR) 25 MG tablet Take 25 mg by mouth daily.   03/12/2017 at 1000  . memantine (NAMENDA) 5 MG tablet Take 5 mg by mouth 2 (two)  times daily.   03/12/2017 at 1000  . omega-3 acid ethyl esters (LOVAZA) 1 g capsule Take 1 g by mouth daily.   03/12/2017 at 1000  . QUEtiapine (SEROQUEL) 100 MG tablet Take 100 mg by mouth at bedtime.    03/11/2017 at pm  . TRESIBA FLEXTOUCH 200 UNIT/ML SOPN Inject 54 Units into the skin at bedtime.    unknown at unknown  . VICTOZA 18 MG/3ML SOPN Inject 1.8 mg into the skin daily.      . citalopram (CELEXA) 40 MG tablet Take 1 tablet by mouth daily.   Not Taking at Unknown time  . cyanocobalamin 500 MCG tablet Take 2 tablets by mouth daily.   Not Taking at Unknown time  . famotidine (PEPCID) 20 MG tablet Take 1 tablet by mouth 2 (two) times daily. For 30 days     . ferrous sulfate 325 (65 FE) MG tablet Take 1 tablet by mouth 3 (three) times daily.  For 30 days     . insulin aspart (NOVOLOG) 100 UNIT/ML injection For Blood Glucose (mg/dL):Less than 70 = Follow Hypoglycemia Protocol70 - 119 = No Insulin120-149 = 1 unit 150-199 = 3 Units200-249 = 5 Units250-299 = 7 Units300-349 = 8 UnitsGreater than 349 = 8 units, recheck glucose in 2 hours and call MD if results greater then 349. **HIGH ALERT MED**  Independent double check required.   This is a fast-acting insulin and must be administered with food or source of glucose.   Not Taking at Unknown time  . LANTUS SOLOSTAR 100 UNIT/ML Solostar Pen Inject 32 Units into the skin daily.  1 Not Taking at Unknown time  . Multiple Vitamin (MULTI-VITAMINS) TABS Take 1 tablet by mouth daily.     . Omega-3 Fatty Acids (FISH OIL) 1000 MG CAPS Take 4 capsules by mouth daily.     [name] oxyCODONE-acetaminophen (PERCOCET/ROXICET) 5-325 MG per tablet Take 1-2 tablets by mouth every 4 (four) hours as needed.  0   . pravastatin (PRAVACHOL) 80 MG tablet Take 1 tablet by mouth daily.  0    Scheduled: . apixaban  5 mg Oral BID  . aspirin EC  325 mg Oral Daily  . carvedilol  3.125 mg Oral BID WC  . citalopram  40 mg Oral Daily  . colesevelam  1,875 mg Oral BID WC  . docusate sodium  100 mg Oral BID  . DULoxetine  30 mg Oral QPM  . DULoxetine  60 mg Oral q morning - 10a  . famotidine  20 mg Oral BID  . fenofibrate  54 mg Oral Daily  . ferrous sulfate  325 mg Oral TID  . gabapentin  300 mg Oral TID  . insulin aspart  0-15 Units Subcutaneous TID WC  . insulin aspart  4 Units Subcutaneous TID WC  . insulin glargine  32 Units Subcutaneous Daily  . isosorbide dinitrate  30 mg Oral QID  . lactulose  20 g Oral BID  . liraglutide  1.8 mg Subcutaneous Daily  . losartan  25 mg Oral Daily  . memantine  5 mg Oral BID  . multivitamin with minerals   Oral Daily  . omega-3 acid ethyl esters  1 g Oral Daily  . pravastatin  80 mg Oral Daily  . QUEtiapine  50 mg Oral QHS  . cyanocobalamin  1,000 mcg Oral Daily     ROS: History obtained from the patient  General ROS: weight gain  Psychological ROS: as noted in HPI Ophthalmic ROS: negative  for - blurry vision, double vision, eye pain or loss of vision ENT ROS: negative for - epistaxis, nasal discharge, oral lesions, sore throat, tinnitus or vertigo Allergy and Immunology ROS: negative for - hives or itchy/watery eyes Hematological and Lymphatic ROS: negative for - bleeding problems, bruising or swollen lymph nodes Endocrine ROS: negative for - galactorrhea, hair pattern changes, polydipsia/polyuria or temperature intolerance Respiratory ROS: negative for - cough, hemoptysis, shortness of breath or wheezing Cardiovascular ROS: negative for - chest pain, dyspnea on exertion, edema or irregular heartbeat Gastrointestinal ROS: negative for - abdominal pain, diarrhea, hematemesis, nausea/vomiting or stool incontinence Genito-Urinary ROS: negative for - dysuria, hematuria, incontinence or urinary frequency/urgency Musculoskeletal ROS: negative for - joint swelling or muscular weakness Neurological ROS: as noted in HPI Dermatological ROS: negative for rash and skin lesion changes  Physical Examination: Blood pressure (!) 165/80, pulse 78, temperature 98.1 F (36.7 C), temperature source Oral, resp. rate 18, height 6' (1.829 m), weight 109.8 kg (242 lb), SpO2 96 %.  HEENT-  Normocephalic, no lesions, without obvious abnormality.  Normal external eye and conjunctiva.  Normal TM's bilaterally.  Normal auditory canals and external ears. Normal external nose, mucus membranes and septum.  Normal pharynx. Cardiovascular- S1, S2 normal, pulses palpable throughout   Lungs- chest clear, no wheezing, rales, normal symmetric air entry Abdomen- soft, non-tender; bowel sounds normal; no masses,  no organomegaly Extremities- no edema Lymph-no adenopathy palpable Musculoskeletal-no joint tenderness, deformity or swelling Skin-warm and dry, no hyperpigmentation,  vitiligo, or suspicious lesions  Neurological Examination   Mental Status: Alert, oriented to President and place.  Does not know date.  Reports that it is 2002.  Speech fluent without evidence of aphasia.  Able to follow 3 step commands with only mild reinforcement required. Cranial Nerves: II: Discs flat bilaterally; LHH, round, reactive to light and accommodation III,IV, VI: ptosis not present, extra-ocular motions intact bilaterally V,VII: smile symmetric, facial light touch sensation normal bilaterally VIII: hearing normal bilaterally IX,X: gag reflex present XI: bilateral shoulder shrug XII: midline tongue extension Motor: Right : Upper extremity   4/5    Left:     Upper extremity   5/5  Lower extremity   5/5     Lower extremity   5/5 Tone and bulk:normal tone throughout; no atrophy noted Sensory: Pinprick and light touch intact throughout, bilaterally Deep Tendon Reflexes: 2+ in the upper extremities, 1+ in the RLE, trace in the LLE Plantars: Right: mute   Left: mute Cerebellar: Normal finger-to-nose and normal heel-to-shin testing bilaterally Gait: not tested due to safety concerns   Laboratory Studies:   Basic Metabolic Panel:  Recent Labs Lab 03/12/17 1624 03/13/17 0444  NA 133* 135  K 4.7 4.5  CL 102 103  CO2 24 25  GLUCOSE 317* 291*  BUN 31* 27*  CREATININE 1.85* 1.60*  CALCIUM 8.8* 8.9    Liver Function Tests:  Recent Labs Lab 03/12/17 1624  AST 26  ALT 18  ALKPHOS 34*  BILITOT 0.5  PROT 6.4*  ALBUMIN 3.2*   No results for input(s): LIPASE, AMYLASE in the last 168 hours.  Recent Labs Lab 03/12/17 1624 03/13/17 0444  AMMONIA 51* 30    CBC:  Recent Labs Lab 03/12/17 1624 03/13/17 0444  WBC 5.1 5.0  HGB 12.7* 14.1  HCT 36.9* 41.1  MCV 87.5 88.7  PLT 226 210    Cardiac Enzymes:  Recent Labs Lab 03/12/17 1624  TROPONINI <0.03    BNP: Invalid input(s): POCBNP  CBG:  Recent Labs Lab 03/12/17 2244 03/13/17 0120  03/13/17 0758 03/13/17 0953 03/13/17 1211  GLUCAP 346* 305* 340* 299* 228*    Microbiology: Results for orders placed or performed during the hospital encounter of 04/23/15  Blood culture (routine x 2)     Status: None   Collection Time: 04/23/15  6:45 PM  Result Value Ref Range Status   Specimen Description BLOOD  Final   Special Requests NONE  Final   Culture NO GROWTH 5 DAYS  Final   Report Status 04/28/2015 FINAL  Final  Blood culture (routine x 2)     Status: None   Collection Time: 04/23/15  6:58 PM  Result Value Ref Range Status   Specimen Description BLOOD  Final   Special Requests NONE  Final   Culture NO GROWTH 5 DAYS  Final   Report Status 04/28/2015 FINAL  Final    Coagulation Studies:  Recent Labs  03/12/17 2342  LABPROT 12.8  INR 0.96    Urinalysis:  Recent Labs Lab 03/12/17 1814  COLORURINE YELLOW*  LABSPEC 1.020  PHURINE 6.0  GLUCOSEU >=500*  HGBUR NEGATIVE  BILIRUBINUR NEGATIVE  KETONESUR NEGATIVE  PROTEINUR 30*  NITRITE NEGATIVE  LEUKOCYTESUR NEGATIVE    Lipid Panel:  No results found for: CHOL, TRIG, HDL, CHOLHDL, VLDL, LDLCALC  HgbA1C: No results found for: HGBA1C  Urine Drug Screen:     Component Value Date/Time   LABOPIA NONE DETECTED 03/12/2017 1814   COCAINSCRNUR NONE DETECTED 03/12/2017 1814   LABBENZ NONE DETECTED 03/12/2017 1814   AMPHETMU NONE DETECTED 03/12/2017 1814   THCU NONE DETECTED 03/12/2017 1814   LABBARB NONE DETECTED 03/12/2017 1814    Alcohol Level: No results for input(s): ETH in the last 168 hours.  Other results: EKG: normal sinus rhythm at 70 bpm.  Imaging: Ct Head Wo Contrast  Result Date: 03/12/2017 CLINICAL DATA:  Lethargic with decreased alertness. EXAM: CT HEAD WITHOUT CONTRAST TECHNIQUE: Contiguous axial images were obtained from the base of the skull through the vertex without intravenous contrast. COMPARISON:  None. FINDINGS: Brain: Hypo attenuation is identified in the posterior right frontal  region, likely related to previous infarct given a degree of overlying ex vacuo dilatation right lateral ventricle. Low density in the right occipital lobe suggests prior right PCA territory infarct. Lacunar infarction noted in the basal ganglia bilaterally. No evidence for acute hemorrhage, hydrocephalus, mass lesion, or abnormal extra-axial fluid collection. Vascular: No hyperdense vessel or unexpected calcification. Skull: No evidence for fracture. No worrisome lytic or sclerotic lesion. Sinuses/Orbits: The visualized paranasal sinuses and mastoid air cells are clear. Visualized portions of the globes and intraorbital fat are unremarkable. Other: None. IMPRESSION: 1. Areas of apparent encephalomalacia in the posterior right frontal lobe and right occipital lobe suggest prior infarct. While these changes are probably chronic given the appearance on CT, MRI would be a more definitive means to evaluate. 2. Bilateral lacunar infarcts in the basal ganglia. Electronically Signed   By: Misty Stanley M.D.   On: 03/12/2017 16:23   Mr Brain Wo Contrast  Result Date: 03/12/2017 CLINICAL DATA:  Acute presentation with diminished mental status. Responsive June are can. Old strokes. EXAM: MRI HEAD WITHOUT CONTRAST TECHNIQUE: Multiplanar, multiecho pulse sequences of the brain and surrounding structures were obtained without intravenous contrast. COMPARISON:  Head CT same day FINDINGS: Brain: Diffusion imaging does not show any acute or subacute infarction. There are mild chronic small-vessel ischemic changes of the pons. No focal cerebellar insult. There is old infarction in  the right occipital lobe with atrophy, encephalomalacia and adjacent gliosis. There is old infarction in the right frontal operculum with atrophy, encephalomalacia and adjacent gliosis. Some changes of laminar necrosis related to the old infarction. There are old small vessel infarctions within the thalami and basal ganglia. There are mild chronic  small-vessel ischemic changes of the white matter bilaterally. No evidence of mass lesion, hydrocephalus or extra-axial collection. Vascular: Major vessels at the base of the brain show flow. Skull and upper cervical spine: Negative Sinuses/Orbits: Clear/normal Other: None significant IMPRESSION: No acute finding by MRI. Old infarctions in the right frontal operculum and right occipital lobe which have progressed to atrophy, encephalomalacia gliosis. Old small vessel infarctions within the basal ganglia, thalami and hemispheric white matter. Electronically Signed   By: Nelson Chimes M.D.   On: 03/12/2017 19:33   Dg Chest Port 1 View  Result Date: 03/12/2017 CLINICAL DATA:  Congestive heart failure EXAM: PORTABLE CHEST 1 VIEW COMPARISON:  04/23/2015 FINDINGS: Cardiomediastinal silhouette is stable. No infiltrate or pleural effusion. No pulmonary edema. Status post median sternotomy. IMPRESSION: No active disease. Electronically Signed   By: Lahoma Crocker M.D.   On: 03/12/2017 16:52   US Abdomen Limited Ruq  Result Date: 03/12/2017 CLINICAL DATA:  Acute onset of confusion. Hepatic encephalopathy. Initial encounter. EXAM: US ABDOMEN LIMITED - RIGHT UPPER QUADRANT COMPARISON:  CT of the abdomen and pelvis performed 04/23/2015 FINDINGS: Gallbladder: Status post cholecystectomy.  No retained stones seen. Common bile duct: Diameter: 0.6 cm, within normal limits in caliber. Liver: No focal lesions seen. Diffusely increased parenchymal echogenicity likely reflects fatty infiltration. IMPRESSION: 1. No acute abnormality seen at the right upper quadrant. 2. Status post cholecystectomy. 3. Diffuse fatty infiltration within the liver. Electronically Signed   By: Garald Balding M.D.   On: 03/12/2017 22:30     Assessment/Plan: 66 year old male presenting with episode of slurred speech, confusion and incoordination.  Appears back to baseline. Have spoken with managing neurologist.  Has had a previous EEG with right  temporal sharp transients.  Is scheduled for prolonged EEG monitoring in about 2 weeks.  Initially ammonia was elevated but was discontinued and today ammonia is in the normal range.    Recommendations: 1.  Keep Seroquel at 50mg  qhs 2.  Decrease Depakote to 375mg  BID 3.  Increase Neurontin to 600mg  TID 4.  EEG not indicated at this time 5.  Patient to continue follow up with outpatient neurologist  Alexis Goodell, MD Neurology [phone] 03/13/2017, 2:13 PM

## 2017-03-13 NOTE — Progress Notes (Signed)
Patient given Insulin; however Family and patient requesting to hold medications until Neurologist seen and MD consulted to discuss current medications and plan of care. Patient resting comfortably and denies any pain at this time.

## 2017-03-13 NOTE — Progress Notes (Signed)
Inpatient Diabetes Program Recommendations  AACE/ADA: New Consensus Statement on Inpatient Glycemic Control (2015)  Target Ranges:  Prepandial:   less than 140 mg/dL      Peak postprandial:   less than 180 mg/dL (1-2 hours)      Critically ill patients:  140 - 180 mg/dL   Results for Ricky Parks, Ricky Parks (MRN [id number]) as of 03/13/2017 12:01  Ref. Range 03/12/2017 16:04 03/12/2017 22:44 03/13/2017 01:20 03/13/2017 07:58 03/13/2017 09:53  Glucose-Capillary Latest Ref Range: 65 - 99 mg/dL 332 (H) 346 (H) 305 (H) 340 (H) 299 (H)    Admit with: AMS  History: DM, CVA  Home DM Meds: Tresiba Insulin 54 units QHS       Humalog 20 units TID with meals       Humalog 2 units for every 50 mg/dl > 150 mg/dl       Victoza 1.8 mg daily  Current Insulin Orders: Lantus 32 units daily      Novolog Moderate Correction Scale/ SSI (0-15 units) TID AC       Novolog 4 units TID       Victoza 1.8 mg daily      Spoke with pt's wife and family at bedside (patient very sleepy).  Wife verified that patient is to be taking the following DM medications: Tresiba Insulin 54 units QHS Humalog 20 units TID with meals Humalog 2 units for every 50 mg/dl > 150 mg/dl Victoza 1.8 mg daily  Reviewed Care Everywhere section of chart as well.  Pt saw his PCP Tommi Emery with [facility]) on 02/15/17.  At that visit, patient was instructed to take the above listed insulins.   MD- Please consider the following:  1. Increase Lantus to 45 units daily Start tomorrow (03/29).  This would be ~80% home dose.  2. Increase Novolog Meal Coverage to: Novolog 10 units TID with meals (50% home dose)    --Will follow patient during hospitalization--  Wyn Quaker RN, MSN, CDE Diabetes Coordinator Inpatient Glycemic Control Team Team Pager: [phone] (8a-5p)

## 2017-03-14 LAB — HIV ANTIBODY (ROUTINE TESTING W REFLEX): HIV Screen 4th Generation wRfx: NONREACTIVE

## 2017-03-14 LAB — GLUCOSE, CAPILLARY
Glucose-Capillary: 265 mg/dL — ABNORMAL HIGH (ref 65–99)
Glucose-Capillary: 220 mg/dL — ABNORMAL HIGH (ref 65–99)
Glucose-Capillary: 236 mg/dL — ABNORMAL HIGH (ref 65–99)

## 2017-03-14 MED ORDER — GABAPENTIN 300 MG PO CAPS: 600.0000 mg | ORAL_CAPSULE | Freq: Three times a day (TID) | ORAL | 0 refills | Status: AC

## 2017-03-14 MED ORDER — DIVALPROEX SODIUM 125 MG PO DR TAB: 375.0000 mg | DELAYED_RELEASE_TABLET | Freq: Two times a day (BID) | ORAL | 0 refills | Status: AC

## 2017-03-14 MED ORDER — QUETIAPINE FUMARATE 50 MG PO TABS: 50.0000 mg | ORAL_TABLET | Freq: Every day | ORAL | 0 refills | Status: AC

## 2017-03-14 NOTE — Discharge Instructions (Signed)
Follow with Primary neurologist in [place] in 1-2 weeks as scheduled.

## 2017-03-14 NOTE — Care Management Note (Addendum)
Case Management Note  Patient Details  Name: Ricky Parks MRN: [id number] Date of Birth: 23-Aug-1951  Subjective/Objective:  TC to spouse and discussed discharge planning. Patient lives at home with his wife. He uses a walker. Needs assistance with adls. PCP is Dr. Ronni Rumble (SP?). Offered choice of home health agencies and wife prefers [facility] in [place]. TC to [facility], Spoke with Crissie Figures [phone]), they will accept patient. Patient will need PT, OT, HHA and SLP. No DME needs per wife.                   Action/Plan: [facility] in [place] for PT, OT, SLP and HHA. Discharging today.    Faxed orders and H & P to [phone]  Expected Discharge Date:  03/14/17               Expected Discharge Plan:  [place]  In-House Referral:     Discharge planning Services  CM Consult  Post Acute Care Choice:  Home Health Choice offered to:     DME Arranged:    DME Agency:     HH Arranged:  PT, OT, Speech Therapy, HHA [place] Agency:  [facility] (now [facility])  Status of Service:  Completed, signed off  If discussed at [place] of Stay Meetings, dates discussed:    Additional Comments:  Jolly Mango, RN 03/14/2017, 10:05 AM

## 2017-03-14 NOTE — Discharge Summary (Signed)
[facility] NAME: Ricky Parks    MR#:  [id number]  DATE OF BIRTH:  November 11, 1951  DATE OF ADMISSION:  03/12/2017 ADMITTING PHYSICIAN: Epifanio Lesches, MD  DATE OF DISCHARGE: 03/14/2017  PRIMARY CARE PHYSICIAN: Pcp Not In System    ADMISSION DIAGNOSIS:  Confusion [R41.0] CHF (congestive heart failure) ([place]) [I50.9] Hyperammonemia ([place]) [E72.20] Acute encephalopathy [G93.40]  DISCHARGE DIAGNOSIS:  Active Problems:   Altered mental status   SECONDARY DIAGNOSIS:   Past Medical History:  Diagnosis Date  . Cancer ([place])   . Coronary artery disease   . Diabetes mellitus without complication ([place])   . Hypercholesteremia   . Hypertension     HOSPITAL COURSE:   #1  altered mental status   likely secondary to polypharmacy and high ammonia and also possible worsening of his dementia. Patient medicines Depakote has been increased recently by neurologist from 250 mg to 500 mg by mouth twice a day and at the same time Seroquel has been increased from 50 mg 100 mg daily and the this happened less than 1 week.    He follows regularly at neurologist office and also had EEG done as outpatient.   Neurologist saw him, with decreased dose of depakote - suggest 375 mg BID and seraquel 50 mg OD    And after lactulose- Ammonia improved, he is alert and back to baseline.    He have appointment for continuous EEG at neurologist. #2.hepatic encephalopathy with elevated ammonia likely secondary to Depakote: Continue lactulose,   Ammonia level came down. Improved. #3 acute on chronic renal failure with CK distress 3: Continue gentle hydration #4 history of cryptogenic strokes: Patient followed by Dr. Lorenda Cahill ,he is on aspirin, and Eliquis, according to family patient needs 5 dayEEG monitoring to evaluate for episodes of unresponsiveness to see if he is having subclinical seizure, consulted neurology while he is here, advised to follow  with his primary neurologist, made some changes In his meds.  DISCHARGE CONDITIONS:   Stable.  CONSULTS OBTAINED:  Treatment Team:  Alexis Goodell, MD  DRUG ALLERGIES:  No Known Allergies  DISCHARGE MEDICATIONS:   Current Discharge Medication List    CONTINUE these medications which have CHANGED   Details  divalproex (DEPAKOTE) 125 MG DR tablet Take 3 tablets (375 mg total) by mouth 2 (two) times daily. Qty: 60 tablet, Refills: 0    gabapentin (NEURONTIN) 300 MG capsule Take 2 capsules (600 mg total) by mouth 3 (three) times daily. Qty: 100 capsule, Refills: 0    QUEtiapine (SEROQUEL) 50 MG tablet Take 1 tablet (50 mg total) by mouth at bedtime. Qty: 30 tablet, Refills: 0      CONTINUE these medications which have NOT CHANGED   Details  apixaban (ELIQUIS) 5 MG TABS tablet Take 5 mg by mouth 2 (two) times daily.    aspirin 325 MG tablet Take 325 mg by mouth daily.    B Complex-C (SUPER B COMPLEX PO) Take by mouth.    butalbital-acetaminophen-caffeine (FIORICET, ESGIC) 50-325-40 MG tablet Take by mouth 2 (two) times daily as needed for headache.    carvedilol (COREG) 3.125 MG tablet Take 3.125 mg by mouth 2 (two) times daily with a meal.    colesevelam (WELCHOL) 625 MG tablet Take 1,875 mg by mouth 2 (two) times daily with a meal.    DULoxetine (CYMBALTA) 30 MG capsule Take 30-60 mg by mouth daily. 60mg  AM & 30mg  PM    fenofibrate (TRICOR)  145 MG tablet Take 145 mg by mouth daily.    fluticasone (FLONASE) 50 MCG/ACT nasal spray Place 2 sprays into both nostrils daily as needed.  Refills: 0    HUMALOG KWIKPEN 100 UNIT/ML KiwkPen Inject 20 Units into the skin 3 (three) times daily with meals. Plus an additional 2 units per sliding scale with meals prn    isosorbide dinitrate (ISORDIL) 30 MG tablet Take 30 mg by mouth 4 (four) times daily.    losartan (COZAAR) 25 MG tablet Take 25 mg by mouth daily.    memantine (NAMENDA) 5 MG tablet Take 5 mg by mouth 2 (two)  times daily.    omega-3 acid ethyl esters (LOVAZA) 1 g capsule Take 1 g by mouth daily.    TRESIBA FLEXTOUCH 200 UNIT/ML SOPN Inject 54 Units into the skin at bedtime.     citalopram (CELEXA) 40 MG tablet Take 1 tablet by mouth daily.    cyanocobalamin 500 MCG tablet Take 2 tablets by mouth daily.    famotidine (PEPCID) 20 MG tablet Take 1 tablet by mouth 2 (two) times daily. For 30 days    ferrous sulfate 325 (65 FE) MG tablet Take 1 tablet by mouth 3 (three) times daily. For 30 days    insulin aspart (NOVOLOG) 100 UNIT/ML injection For Blood Glucose (mg/dL):Less than 70 = Follow Hypoglycemia Protocol70 - 119 = No Insulin120-149 = 1 unit 150-199 = 3 Units200-249 = 5 Units250-299 = 7 Units300-349 = 8 UnitsGreater than 349 = 8 units, recheck glucose in 2 hours and call MD if results greater then 349. **HIGH ALERT MED**  Independent double check required.   This is a fast-acting insulin and must be administered with food or source of glucose.    LANTUS SOLOSTAR 100 UNIT/ML Solostar Pen Inject 32 Units into the skin daily. Refills: 1    Multiple Vitamin (MULTI-VITAMINS) TABS Take 1 tablet by mouth daily.    Omega-3 Fatty Acids (FISH OIL) 1000 MG CAPS Take 4 capsules by mouth daily.    oxyCODONE-acetaminophen (PERCOCET/ROXICET) 5-325 MG per tablet Take 1-2 tablets by mouth every 4 (four) hours as needed. Refills: 0    pravastatin (PRAVACHOL) 80 MG tablet Take 1 tablet by mouth daily. Refills: 0      STOP taking these medications     Cholecalciferol (VITAMIN D3) 1000 UNITS CAPS      VICTOZA 18 MG/3ML SOPN          DISCHARGE INSTRUCTIONS:    Follow with primary neurologist in 2 weeks.  If you experience worsening of your admission symptoms, develop shortness of breath, life threatening emergency, suicidal or homicidal thoughts you must seek medical attention immediately by calling 911 or calling your MD immediately  if symptoms less severe.  You Must read complete  instructions/literature along with all the possible adverse reactions/side effects for all the Medicines you take and that have been prescribed to you. Take any new Medicines after you have completely understood and accept all the possible adverse reactions/side effects.   Please note  You were cared for by a hospitalist during your hospital stay. If you have any questions about your discharge medications or the care you received while you were in the hospital after you are discharged, you can call the unit and asked to speak with the hospitalist on call if the hospitalist that took care of you is not available. Once you are discharged, your primary care physician will handle any further medical issues. Please note that NO REFILLS  for any discharge medications will be authorized once you are discharged, as it is imperative that you return to your primary care physician (or establish a relationship with a primary care physician if you do not have one) for your aftercare needs so that they can reassess your need for medications and monitor your lab values.    Today   CHIEF COMPLAINT:   Chief Complaint  Patient presents with  . Altered Mental Status    HISTORY OF PRESENT ILLNESS:  Ricky Parks  is a 66 y.o. male with a known history of Cryptogenic strokes brought in by family because of sudden onset of altered mental status associated with problems with coordination, slight slurred speech. Patient was up with Dr. Lorenda Cahill from [facility] neurology, last seen neurologist in a week ago that time his Depakote has been increased to 500 mg twice a day, patient also sees psychiatric for depression, recently increased Seroquel from 50 mg 200 mg on Thursday. And patient has been having episodes of confusion recently. Patient did Depakote level undetectable in the blood today with elevated ammonia up to more than 50. MRI of the brain showed old strokes in the right frontal, right occipital area but no new  strokes. Patient is eating his dinner at this time.   VITAL SIGNS:  Blood pressure (!) 168/95, pulse 86, temperature 97.6 F (36.4 C), temperature source Oral, resp. rate 14, height 6' (1.829 m), weight 106.6 kg (235 lb), SpO2 95 %.  I/O:   Intake/Output Summary (Last 24 hours) at 03/14/17 9937 Last data filed at 03/14/17 0900  Gross per 24 hour  Intake              480 ml  Output                0 ml  Net              480 ml    PHYSICAL EXAMINATION:  GENERAL:  66 y.o.-year-old patient lying in the bed with no acute distress.  EYES: Pupils equal, round, reactive to light and accommodation. No scleral icterus. Extraocular muscles intact.  HEENT: Head atraumatic, normocephalic. Oropharynx and nasopharynx clear.  NECK:  Supple, no jugular venous distention. No thyroid enlargement, no tenderness.  LUNGS: Normal breath sounds bilaterally, no wheezing, rales,rhonchi or crepitation. No use of accessory muscles of respiration.  CARDIOVASCULAR: S1, S2 normal. No murmurs, rubs, or gallops.  ABDOMEN: Soft, non-tender, non-distended. Bowel sounds present. No organomegaly or mass.  EXTREMITIES: No pedal edema, cyanosis, or clubbing.  NEUROLOGIC: Cranial nerves II through XII are intact. Muscle strength 5/5 in all extremities. Sensation intact. Gait not checked.  PSYCHIATRIC: The patient is alert and oriented x 3.  SKIN: No obvious rash, lesion, or ulcer.   DATA REVIEW:   CBC  Recent Labs Lab 03/13/17 0444  WBC 5.0  HGB 14.1  HCT 41.1  PLT 210    Chemistries   Recent Labs Lab 03/12/17 1624 03/13/17 0444  NA 133* 135  K 4.7 4.5  CL 102 103  CO2 24 25  GLUCOSE 317* 291*  BUN 31* 27*  CREATININE 1.85* 1.60*  CALCIUM 8.8* 8.9  AST 26  --   ALT 18  --   ALKPHOS 34*  --   BILITOT 0.5  --     Cardiac Enzymes  Recent Labs Lab 03/12/17 1624  TROPONINI <0.03    Microbiology Results  Results for orders placed or performed during the hospital encounter of 04/23/15  Blood  culture (routine x 2)     Status: None   Collection Time: 04/23/15  6:45 PM  Result Value Ref Range Status   Specimen Description BLOOD  Final   Special Requests NONE  Final   Culture NO GROWTH 5 DAYS  Final   Report Status 04/28/2015 FINAL  Final  Blood culture (routine x 2)     Status: None   Collection Time: 04/23/15  6:58 PM  Result Value Ref Range Status   Specimen Description BLOOD  Final   Special Requests NONE  Final   Culture NO GROWTH 5 DAYS  Final   Report Status 04/28/2015 FINAL  Final    RADIOLOGY:  Ct Head Wo Contrast  Result Date: 03/12/2017 CLINICAL DATA:  Lethargic with decreased alertness. EXAM: CT HEAD WITHOUT CONTRAST TECHNIQUE: Contiguous axial images were obtained from the base of the skull through the vertex without intravenous contrast. COMPARISON:  None. FINDINGS: Brain: Hypo attenuation is identified in the posterior right frontal region, likely related to previous infarct given a degree of overlying ex vacuo dilatation right lateral ventricle. Low density in the right occipital lobe suggests prior right PCA territory infarct. Lacunar infarction noted in the basal ganglia bilaterally. No evidence for acute hemorrhage, hydrocephalus, mass lesion, or abnormal extra-axial fluid collection. Vascular: No hyperdense vessel or unexpected calcification. Skull: No evidence for fracture. No worrisome lytic or sclerotic lesion. Sinuses/Orbits: The visualized paranasal sinuses and mastoid air cells are clear. Visualized portions of the globes and intraorbital fat are unremarkable. Other: None. IMPRESSION: 1. Areas of apparent encephalomalacia in the posterior right frontal lobe and right occipital lobe suggest prior infarct. While these changes are probably chronic given the appearance on CT, MRI would be a more definitive means to evaluate. 2. Bilateral lacunar infarcts in the basal ganglia. Electronically Signed   By: Misty Stanley M.D.   On: 03/12/2017 16:23   Mr Brain Wo  Contrast  Result Date: 03/12/2017 CLINICAL DATA:  Acute presentation with diminished mental status. Responsive June are can. Old strokes. EXAM: MRI HEAD WITHOUT CONTRAST TECHNIQUE: Multiplanar, multiecho pulse sequences of the brain and surrounding structures were obtained without intravenous contrast. COMPARISON:  Head CT same day FINDINGS: Brain: Diffusion imaging does not show any acute or subacute infarction. There are mild chronic small-vessel ischemic changes of the pons. No focal cerebellar insult. There is old infarction in the right occipital lobe with atrophy, encephalomalacia and adjacent gliosis. There is old infarction in the right frontal operculum with atrophy, encephalomalacia and adjacent gliosis. Some changes of laminar necrosis related to the old infarction. There are old small vessel infarctions within the thalami and basal ganglia. There are mild chronic small-vessel ischemic changes of the white matter bilaterally. No evidence of mass lesion, hydrocephalus or extra-axial collection. Vascular: Major vessels at the base of the brain show flow. Skull and upper cervical spine: Negative Sinuses/Orbits: Clear/normal Other: None significant IMPRESSION: No acute finding by MRI. Old infarctions in the right frontal operculum and right occipital lobe which have progressed to atrophy, encephalomalacia gliosis. Old small vessel infarctions within the basal ganglia, thalami and hemispheric white matter. Electronically Signed   By: Nelson Chimes M.D.   On: 03/12/2017 19:33   Dg Chest Port 1 View  Result Date: 03/12/2017 CLINICAL DATA:  Congestive heart failure EXAM: PORTABLE CHEST 1 VIEW COMPARISON:  04/23/2015 FINDINGS: Cardiomediastinal silhouette is stable. No infiltrate or pleural effusion. No pulmonary edema. Status post median sternotomy. IMPRESSION: No active disease. Electronically Signed   By: Julien Girt  Pop M.D.   On: 03/12/2017 16:52   US Abdomen Limited Ruq  Result Date: 03/12/2017 CLINICAL  DATA:  Acute onset of confusion. Hepatic encephalopathy. Initial encounter. EXAM: US ABDOMEN LIMITED - RIGHT UPPER QUADRANT COMPARISON:  CT of the abdomen and pelvis performed 04/23/2015 FINDINGS: Gallbladder: Status post cholecystectomy.  No retained stones seen. Common bile duct: Diameter: 0.6 cm, within normal limits in caliber. Liver: No focal lesions seen. Diffusely increased parenchymal echogenicity likely reflects fatty infiltration. IMPRESSION: 1. No acute abnormality seen at the right upper quadrant. 2. Status post cholecystectomy. 3. Diffuse fatty infiltration within the liver. Electronically Signed   By: Garald Balding M.D.   On: 03/12/2017 22:30    EKG:   Orders placed or performed during the hospital encounter of 03/12/17  . EKG 12-Lead  . EKG 12-Lead  . EKG      Management plans discussed with the patient, family and they are in agreement.  CODE STATUS:     Code Status Orders        Start     Ordered   03/12/17 2053  Do not attempt resuscitation (DNR)  Continuous    Question Answer Comment  In the event of cardiac or respiratory ARREST Do not call a "code blue"   In the event of cardiac or respiratory ARREST Do not perform Intubation, CPR, defibrillation or ACLS   In the event of cardiac or respiratory ARREST Use medication by any route, position, wound care, and other measures to relive pain and suffering. May use oxygen, suction and manual treatment of airway obstruction as needed for comfort.      03/12/17 2053    Code Status History    Date Active Date Inactive Code Status Order ID Comments User Context   This patient has a current code status but no historical code status.    Advance Directive Documentation     Most Recent Value  Type of Advance Directive  Healthcare Power of Attorney  Pre-existing out of facility DNR order (yellow form or pink MOST form)  -  "MOST" Form in Place?  -      TOTAL TIME TAKING CARE OF THIS PATIENT: 35 minutes.    Vaughan Basta M.D on 03/14/2017 at 9:22 AM  Between 7am to 6pm - Pager - [phone]  After 6pm go to [email or web address] - password [email or web address] [facility]  Office  [phone]  CC: Primary care physician; Pcp Not In System   Note: This dictation was prepared with Dragon dictation along with smaller phrase technology. Any transcriptional errors that result from this process are unintentional.

## 2017-03-14 NOTE — Progress Notes (Signed)
Patient discharged home with home health. Prescriptions given to patient. All discharge instructions given to patient and wife and all questions answered.

## 2017-03-14 NOTE — Plan of Care (Signed)
Problem: Education: Goal: Knowledge of [facility] Education information/materials will improve Outcome: Not Progressing Pt still disoriented to place, time and situation. At times pt can be impulsive with inappropriate behavior/comments to staff members.   Problem: Safety: Goal: Ability to remain free from injury will improve Outcome: Not Progressing Impulsive, unsafe behaviors. Pt on low bed with floor mats. Yellow socks and arm band. Room in view of nurses station. Frequent rounds.

## 2017-10-04 IMAGING — US US ABDOMEN LIMITED
1 series · 14 of 25 positions shown · non-contrast
Comparison: CT of the abdomen and pelvis performed 04/23/2015

CLINICAL DATA: Acute onset of confusion. Hepatic encephalopathy.
Initial encounter.

EXAM:
US ABDOMEN LIMITED - RIGHT UPPER QUADRANT

[Series 1: us abdomen limited · 0.27mm/px · 14 of 31 slices shown]
[im 1/31]
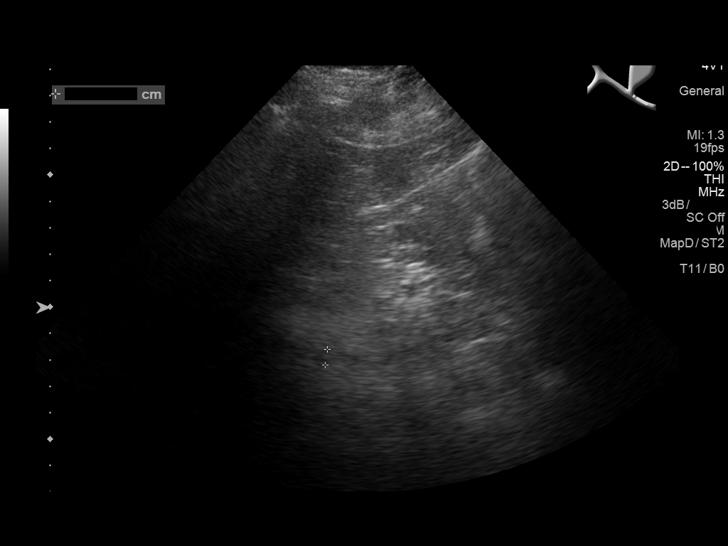
[im 3/31]
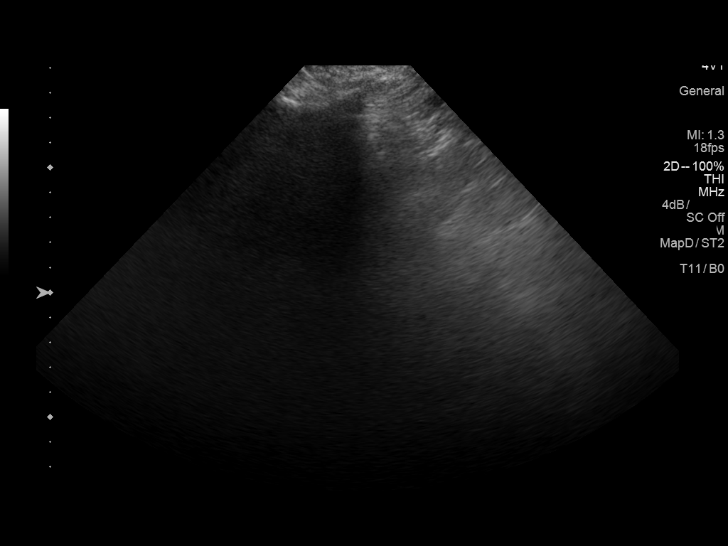
[im 6/31]
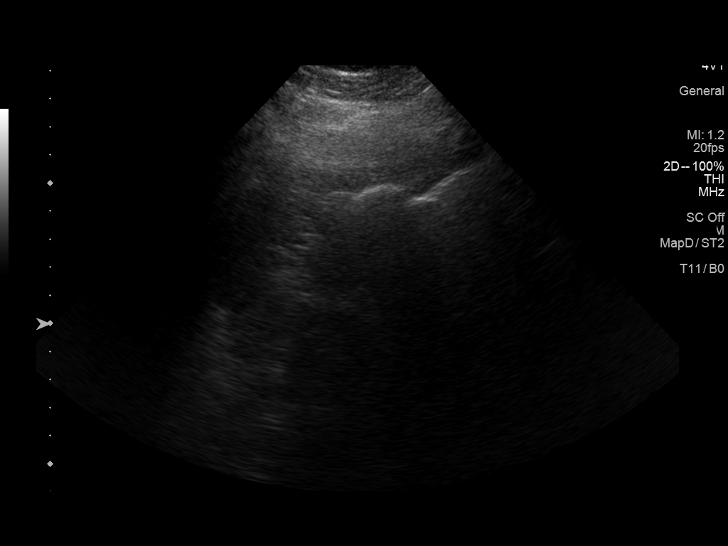
[im 8/31]
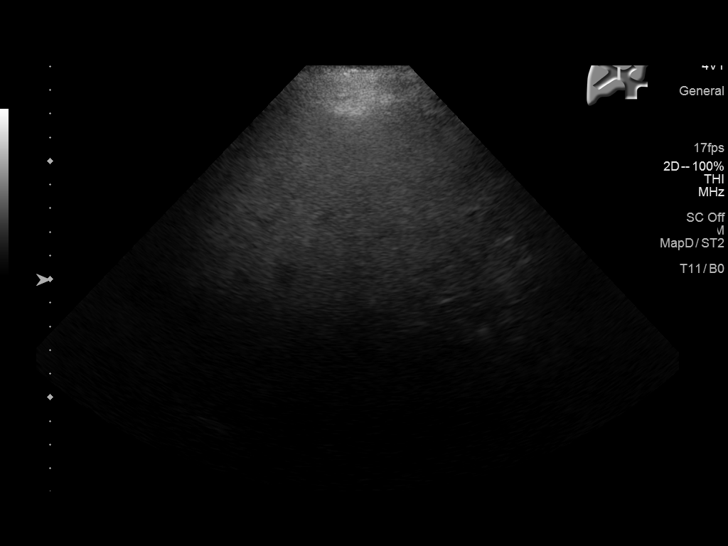
[im 11/31]
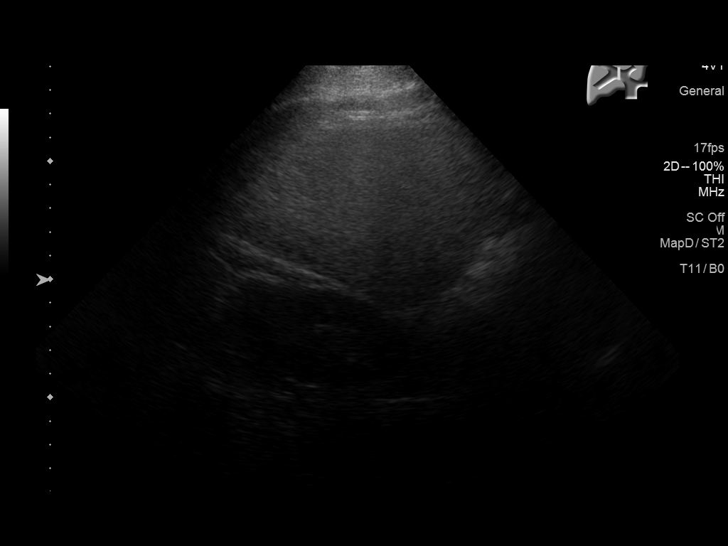
[im 12/31]
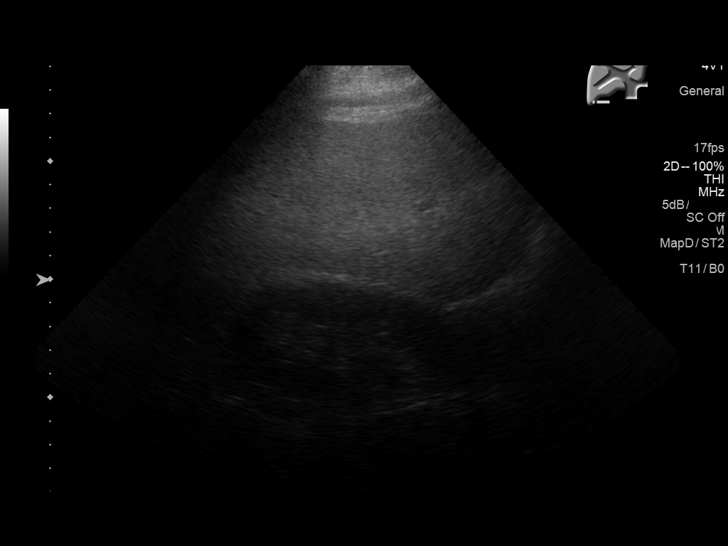
[im 14/31]
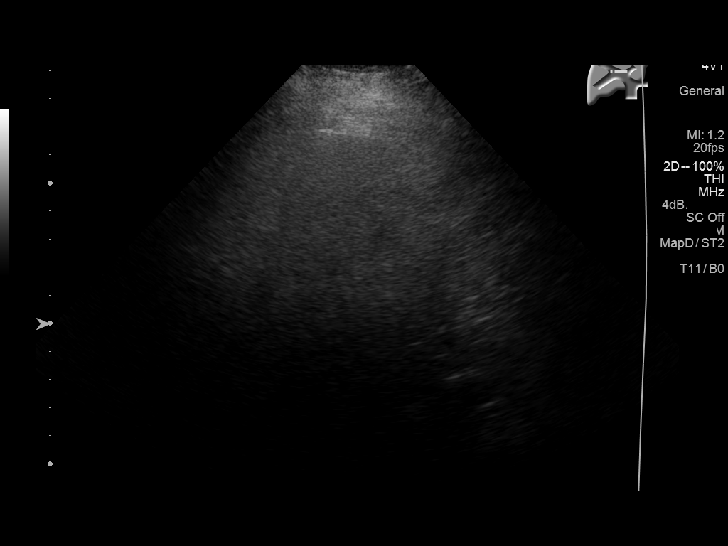
[im 17/31]
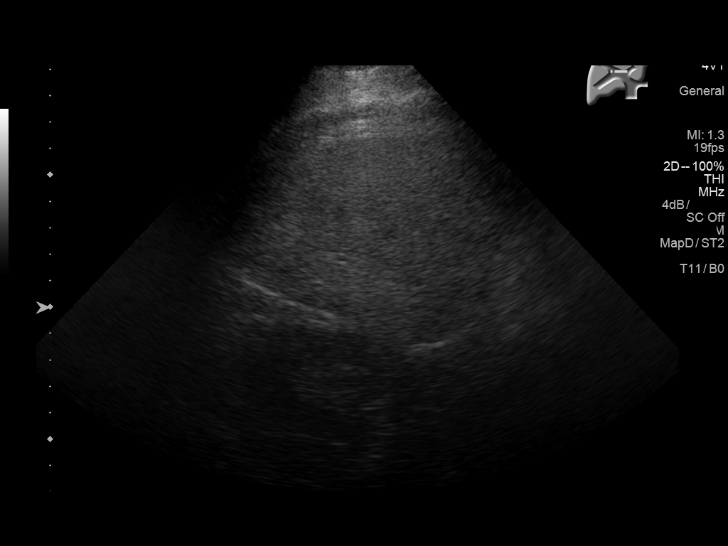
[im 19/31]
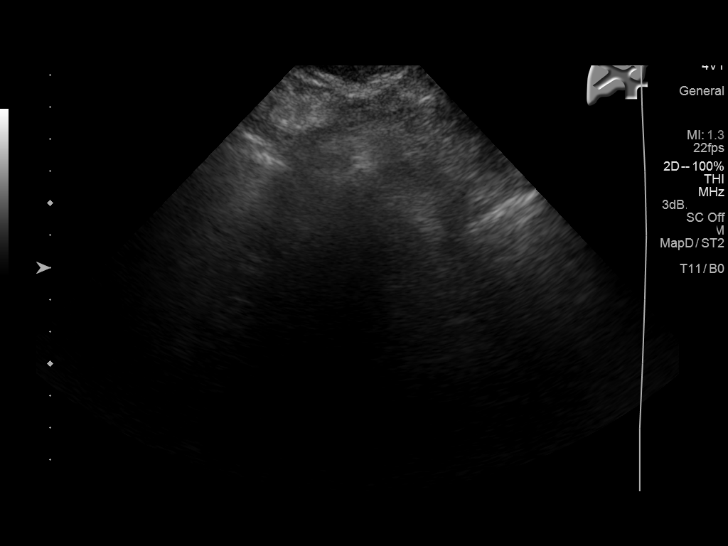
[im 21/31]
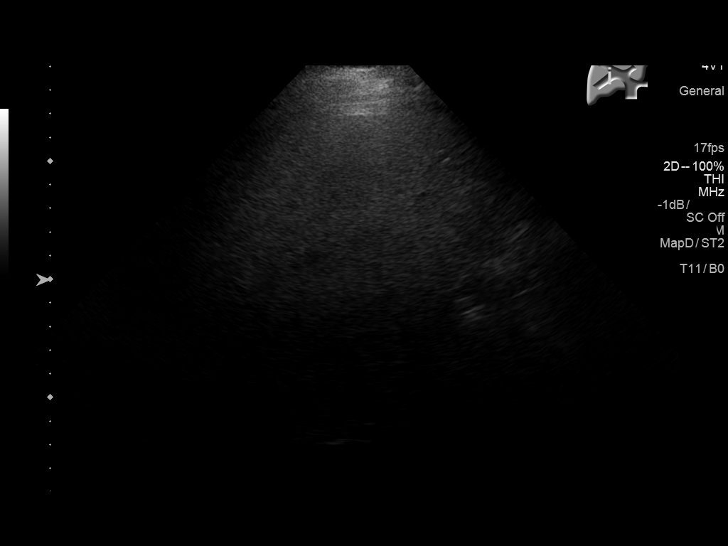
[im 23/31]
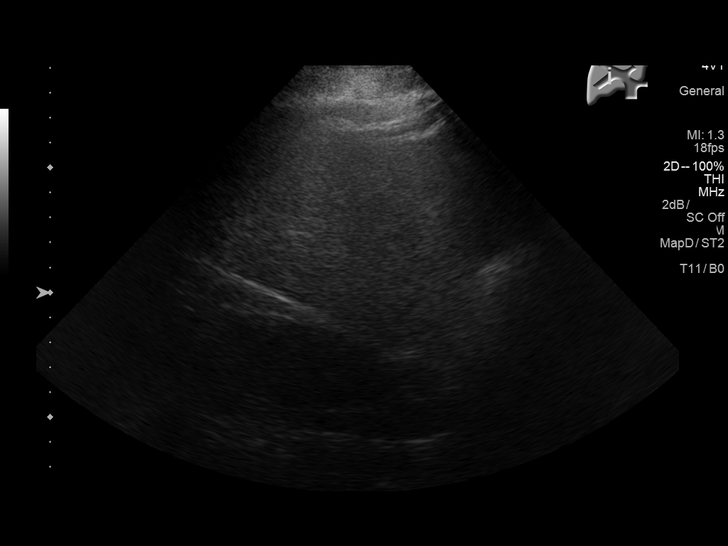
[im 26/31]
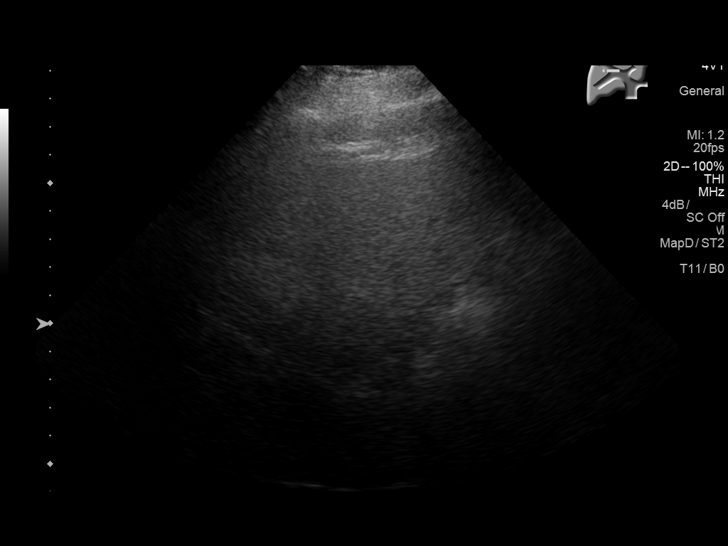
[im 28/31]
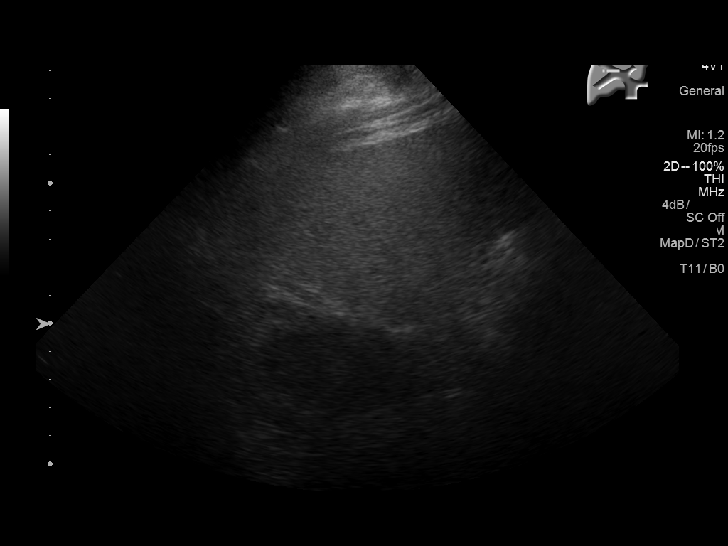
[im 31/31]
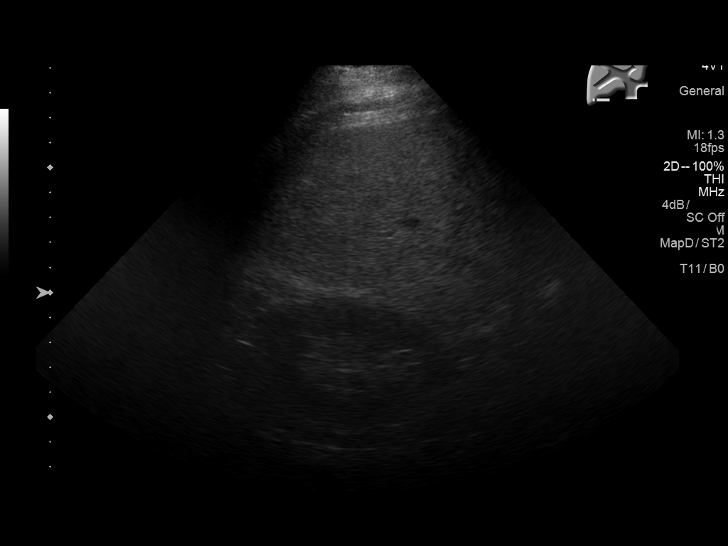

[14 of 25 positions shown; findings below may reference images not displayed]

FINDINGS: Gallbladder:

Status post cholecystectomy.  No retained stones seen.

Common bile duct:

Diameter: 0.6 cm, within normal limits in caliber.

Liver:

No focal lesions seen. Diffusely increased parenchymal echogenicity
likely reflects fatty infiltration.
IMPRESSION: 1. No acute abnormality seen at the right upper quadrant.
2. Status post cholecystectomy.
3. Diffuse fatty infiltration within the liver.

## 2017-11-16 DEATH — deceased
